# Patient Record
Sex: Male | Born: 1985
Health system: Southern US, Community
[De-identification: ages and names within clinical notes are randomized; demographics above are authoritative.]

## PROBLEM LIST (undated history)

## (undated) DIAGNOSIS — K219 Gastro-esophageal reflux disease without esophagitis: Secondary | ICD-10-CM

## (undated) DIAGNOSIS — L600 Ingrowing nail: Secondary | ICD-10-CM

## (undated) DIAGNOSIS — Z8619 Personal history of other infectious and parasitic diseases: Secondary | ICD-10-CM

## (undated) HISTORY — DX: Gastro-esophageal reflux disease without esophagitis: K21.9

## (undated) HISTORY — DX: Ingrowing nail: L60.0

## (undated) HISTORY — DX: Personal history of other infectious and parasitic diseases: Z86.19

## (undated) HISTORY — PX: NO PAST SURGERIES: SHX2092

## (undated) HISTORY — PX: IRRIGATION AND DEBRIDEMENT SEBACEOUS CYST: SHX5255

---

## 2014-10-30 ENCOUNTER — Emergency Department (HOSPITAL_COMMUNITY)
Admission: EM | Admit: 2014-10-30 | Discharge: 2014-10-31 | Disposition: A | Discharge status: Home / Self Care | Payer: Self-pay | Attending: Emergency Medicine | Admitting: Emergency Medicine

## 2014-10-30 ENCOUNTER — Encounter (HOSPITAL_COMMUNITY): Payer: Self-pay | Admitting: Family Medicine

## 2014-10-30 ENCOUNTER — Emergency Department (HOSPITAL_COMMUNITY): Payer: Self-pay

## 2014-10-30 DIAGNOSIS — R0602 Shortness of breath: Secondary | ICD-10-CM | POA: Insufficient documentation

## 2014-10-30 DIAGNOSIS — R002 Palpitations: Secondary | ICD-10-CM | POA: Insufficient documentation

## 2014-10-30 DIAGNOSIS — R42 Dizziness and giddiness: Secondary | ICD-10-CM | POA: Insufficient documentation

## 2014-10-30 DIAGNOSIS — R079 Chest pain, unspecified: Secondary | ICD-10-CM

## 2014-10-30 LAB — BASIC METABOLIC PANEL
Anion gap: 12 (ref 5–15)
BUN: 10 mg/dL (ref 6–23)
CALCIUM: 9.3 mg/dL (ref 8.4–10.5)
CO2: 25 mmol/L (ref 19–32)
Chloride: 99 mEq/L (ref 96–112)
Creatinine, Ser: 1.1 mg/dL (ref 0.50–1.35)
GFR calc Af Amer: >90 mL/min (ref >90)
GFR calc non Af Amer: 89 mL/min — ABNORMAL LOW (ref >90)
Glucose, Bld: 119 mg/dL — ABNORMAL HIGH (ref 70–99)
POTASSIUM: 3.7 mmol/L (ref 3.5–5.1)
SODIUM: 136 mmol/L (ref 135–145)

## 2014-10-30 LAB — CBC
HCT: 45.8 % (ref 39.0–52.0)
Hemoglobin: 16 g/dL (ref 13.0–17.0)
MCH: 30.1 pg (ref 26.0–34.0)
MCHC: 34.9 g/dL (ref 30.0–36.0)
MCV: 86.3 fL (ref 78.0–100.0)
Platelets: 250 10*3/uL (ref 150–400)
RBC: 5.31 MIL/uL (ref 4.22–5.81)
RDW: 12.6 % (ref 11.5–15.5)
WBC: 16.9 10*3/uL — ABNORMAL HIGH (ref 4.0–10.5)

## 2014-10-30 LAB — TSH: TSH: 0.961 u[IU]/mL (ref 0.350–4.500)

## 2014-10-30 LAB — I-STAT TROPONIN, ED
TROPONIN I, POC: 0 ng/mL (ref 0.00–0.08)
Troponin i, poc: 0 ng/mL (ref 0.00–0.08)

## 2014-10-30 LAB — D-DIMER, QUANTITATIVE: D-Dimer, Quant: <0.27 ug/mL-FEU (ref 0.00–0.48)

## 2014-10-30 MED ORDER — SODIUM CHLORIDE 0.9 % IV BOLUS (SEPSIS)
1000.0000 mL | Freq: Once | INTRAVENOUS | Status: AC
Start: 2014-10-30 — End: 2014-10-30
  Administered 2014-10-30: 1000 mL via INTRAVENOUS

## 2014-10-30 NOTE — ED Notes (Signed)
Report to melanie, rn.  Pt care transferred.

## 2014-10-30 NOTE — ED Notes (Signed)
Pt had 1 min episode today at work of where his heart felt like it would slow down and then it sped up. sts he was SOB. sts he has had a few of these episodes. sts he was sitting at his desk at work.denies chest pain and SOB currently.

## 2014-10-30 NOTE — ED Provider Notes (Signed)
CSN: [id number]     Arrival date & time 10/30/14  1833 History   First MD Initiated Contact with Patient 10/30/14 2000     Chief Complaint  Patient presents with  . Palpitations  . Shortness of Breath     (Consider location/radiation/quality/duration/timing/severity/associated sxs/prior Treatment) The history is provided by the patient. No language interpreter was used.  Maurice Jimenez is a 29 y/o M with no known significant PMHx presenting to the ED with chest palpitations that occurred today while the patient was at work. Patient reported that sits at a desk for most of the day - stated that while at work he had chest palpitations. Reported that he felt his heart slow down and then speed up - stated that it lasted for approximately 30-40 seconds. Reported that during this episode he felt hot and mildly light-headed with a little shortness of breath. Reported that he had another episode when driving home - stated that he stopped at a gas station and took ASA 325 mg. Reported that his last episode was at 5:45 PM this evening. Denied diaphoresis, chest pain, difficulty breathing, loss of consciousness, dizziness, tunnel vision, blurred vision, sudden loss of vision, nausea, vomiting, diarrhea, abdominal pain, neck pain, neck stiffness, headache, cough, nasal congestion, numbness, tingling, weakness, fever, chills. Denied history of hypertension and diabetes. Denied history of cardiac disease in the family. PCP none  History reviewed. No pertinent past medical history. History reviewed. No pertinent past surgical history. History reviewed. No pertinent family history. History  Substance Use Topics  . Smoking status: Never Smoker   . Smokeless tobacco: Not on file  . Alcohol Use: Not on file    Review of Systems  Constitutional: Negative for fever and chills.  Eyes: Negative for visual disturbance.  Respiratory: Positive for shortness of breath. Negative for cough and chest tightness.    Cardiovascular: Positive for palpitations. Negative for chest pain and leg swelling.  Gastrointestinal: Negative for nausea and vomiting.  Musculoskeletal: Negative for back pain, neck pain and neck stiffness.  Neurological: Positive for light-headedness. Negative for dizziness, weakness, numbness and headaches.      Allergies  Review of patient's allergies indicates no known allergies.  Home Medications   Prior to Admission medications   Not on File   BP 116/62 mmHg  Pulse 89  Temp(Src) 98.4 F (36.9 C) (Oral)  Resp 22  Ht 6' (1.829 m)  Wt 245 lb (111.131 kg)  BMI 33.22 kg/m2  SpO2 98% Physical Exam  Constitutional: He is oriented to person, place, and time. He appears well-developed and well-nourished. No distress.  HENT:  Head: Normocephalic and atraumatic.  Mouth/Throat: Oropharynx is clear and moist. No oropharyngeal exudate.  Eyes: Conjunctivae and EOM are normal. Pupils are equal, round, and reactive to light. Right eye exhibits no discharge. Left eye exhibits no discharge.  Neck: Normal range of motion. Neck supple. No tracheal deviation present.  Negative neck stiffness Negative nuchal rigidity Negative cervical lymphadenopathy Negative meningeal signs  Cardiovascular: Normal rate, regular rhythm and normal heart sounds.  Exam reveals no friction rub.   No murmur heard. Pulses:      Radial pulses are 2+ on the right side, and 2+ on the left side.       Dorsalis pedis pulses are 2+ on the right side, and 2+ on the left side.  Cap refill less than 3 seconds Negative swelling or pitting edema identified to lower extremities bilaterally  Pulmonary/Chest: Effort normal and breath sounds normal. No respiratory  distress. He has no wheezes. He has no rales. He exhibits no tenderness.  Patient is able to speak in full sentences without difficulty Negative use of accessory muscles Negative stridor  Musculoskeletal: Normal range of motion.  Full ROM to upper and lower  extremities without difficulty noted, negative ataxia noted.  Lymphadenopathy:    He has no cervical adenopathy.  Neurological: He is alert and oriented to person, place, and time. No cranial nerve deficit. He exhibits normal muscle tone. Coordination normal.  Cranial nerves III-XII grossly intact Strength 5+/5+ to upper and lower extremities bilaterally with resistance applied, equal distribution noted Equal grip strength bilaterally Negative facial droop Negative slurred speech Negative aphasia Negative arm drift Fine motor skills intact Patient follows commands well Patient responds to questions appropriately  Skin: Skin is warm and dry. No rash noted. He is not diaphoretic. No erythema.  Psychiatric: He has a normal mood and affect. His behavior is normal. Thought content normal.  Nursing note and vitals reviewed.   ED Course  Procedures (including critical care time) Labs Review Labs Reviewed  CBC - Abnormal; Notable for the following:    WBC 16.9 (*)    All other components within normal limits  BASIC METABOLIC PANEL - Abnormal; Notable for the following:    Glucose, Bld 119 (*)    GFR calc non Af Amer 89 (*)    All other components within normal limits  D-DIMER, QUANTITATIVE  TSH  T4, FREE  I-STAT TROPOININ, ED  I-STAT TROPOININ, ED    Imaging Review Dg Chest 2 View  10/30/2014   CLINICAL DATA:  Pt states he experienced a funny feeling in his chest today, described as his "heart slowing down." Pt states he had trouble feeling his pulse during this time and shortly after he experienced his heart racing.  Pt has no hx of any similar issues.  Nonsmoker.  EXAM: CHEST  2 VIEW  COMPARISON:  10/21/2007  FINDINGS: The heart size and mediastinal contours are within normal limits. Both lungs are clear. The visualized skeletal structures are unremarkable.  IMPRESSION: No active cardiopulmonary disease.   Electronically Signed   By: Lajean Manes M.D.   On: 10/30/2014 19:11      EKG Interpretation   Date/Time:  Monday October 30 2014 18:40:10 EST Ventricular Rate:  110 PR Interval:  164 QRS Duration: 92 QT Interval:  312 QTC Calculation: 422 R Axis:   -14 Text Interpretation:  Sinus tachycardia Left ventricular hypertrophy  Abnormal ECG Confirmed by Jeneen Rinks  MD, [place] ([id number]) on 10/30/2014 6:44:13 PM      MDM   Final diagnoses:  Palpitations    Medications  sodium chloride 0.9 % bolus 1,000 mL (0 mLs Intravenous Stopped 10/30/14 2220)    Filed Vitals:   10/30/14 2216 10/30/14 2230 10/30/14 2330 10/31/14 0006  BP: 121/76 122/65 124/77 116/62  Pulse: 94 82 80 89  Temp:    98.4 F (36.9 C)  TempSrc:    Oral  Resp: 18 17 11 22   Height:      Weight:      SpO2: 97% 98% 96% 98%   EKG noted sinus tachycardia with a heart rate 110 bpm with left ventricular hypertrophy-no previous EKG to compare. I-STAT troponin negative elevation. Second i-STAT troponin negative elevation. D-dimer negative elevation. CBC noted elevated white blood cell count-16.9. Hemoglobin 16.0, hematocrit 45.8. BNP unremarkable. Chest x-ray negative for acute cardiopulmonary disease. TSH negative elevation. T4 free pending.  Doubt ACS. Doubt PE. Patient presenting  to the ED with chest palpitations that occurred today, 2 episodes. Patient reported that he's been stressed out and appears to be mildly anxious. Patient given IV fluids in ED setting with relief of tachycardia - heart rate decreased from 119 bpm to 80 bpm. Patient seen and assessed by attending physician, Dr. Vallery Ridge, who cleared patient for discharge. Patient stable, afebrile. Patient not septic appearing. Definitive etiology of palpitations unknown-negative auscultation of murmurs. Negative signs of respiratory distress. Discharged patient. Referred patient to PCP and cardiology. Discussed with patient to rest and stay hydrated. Discussed with patient to have white blood cell count rechecked secondary to it being elevated  today-patient not septic appearing, no signs of infectious processes, patient denies remaining complaints. Discussed with patient to rest and stay hydrated. Discussed with patient to closely monitor symptoms and if symptoms are to worsen or change to report back to the ED - strict return instructions given.  Patient agreed to plan of care, understood, all questions answered.  Jamse Mead, PA-C 10/31/14 0016  Charlesetta Shanks, MD 10/31/14 2226

## 2014-10-31 LAB — T4, FREE: FREE T4: 1.17 ng/dL (ref 0.80–1.80)

## 2014-10-31 NOTE — Discharge Instructions (Signed)
Please call your doctor for a followup appointment within 24-48 hours. When you talk to your doctor please let them know that you were seen in the emergency department and have them acquire all of your records so that they can discuss the findings with you and formulate a treatment plan to fully care for your new and ongoing problems. Please call and follow-up with her primary care provider Please have white blood cell count re-checked for today it was mildly elevated Please rest and stay hydrated Please follow up with cardiology Please drink plenty of fluids Please continue to monitor symptoms closely and if symptoms are to worsen or change (fever greater than 101, chills, sweating, nausea, vomiting, chest pain, shortness of breathe, difficulty breathing, weakness, numbness, tingling, worsening or changes to pain pattern, headache, dizziness, visual changes, fainting) please report back to the Emergency Department immediately.   Palpitations A palpitation is the feeling that your heartbeat is irregular or is faster than normal. It may feel like your heart is fluttering or skipping a beat. Palpitations are usually not a serious problem. However, in some cases, you may need further medical evaluation. CAUSES  Palpitations can be caused by:  Smoking.  Caffeine or other stimulants, such as diet pills or energy drinks.  Alcohol.  Stress and anxiety.  Strenuous physical activity.  Fatigue.  Certain medicines.  Heart disease, especially if you have a history of irregular heart rhythms (arrhythmias), such as atrial fibrillation, atrial flutter, or supraventricular tachycardia.  An improperly working pacemaker or defibrillator. DIAGNOSIS  To find the cause of your palpitations, your health care provider will take your medical history and perform a physical exam. Your health care provider may also have you take a test called an ambulatory electrocardiogram (ECG). An ECG records your heartbeat  patterns over a 24-hour period. You may also have other tests, such as:  Transthoracic echocardiogram (TTE). During echocardiography, sound waves are used to evaluate how blood flows through your heart.  Transesophageal echocardiogram (TEE).  Cardiac monitoring. This allows your health care provider to monitor your heart rate and rhythm in real time.  Holter monitor. This is a portable device that records your heartbeat and can help diagnose heart arrhythmias. It allows your health care provider to track your heart activity for several days, if needed.  Stress tests by exercise or by giving medicine that makes the heart beat faster. TREATMENT  Treatment of palpitations depends on the cause of your symptoms and can vary greatly. Most cases of palpitations do not require any treatment other than time, relaxation, and monitoring your symptoms. Other causes, such as atrial fibrillation, atrial flutter, or supraventricular tachycardia, usually require further treatment. HOME CARE INSTRUCTIONS   Avoid:  Caffeinated coffee, tea, soft drinks, diet pills, and energy drinks.  Chocolate.  Alcohol.  Stop smoking if you smoke.  Reduce your stress and anxiety. Things that can help you relax include:  A method of controlling things in your body, such as your heartbeats, with your mind (biofeedback).  Yoga.  Meditation.  Physical activity such as swimming, jogging, or walking.  Get plenty of rest and sleep. SEEK MEDICAL CARE IF:   You continue to have a fast or irregular heartbeat beyond 24 hours.  Your palpitations occur more often. SEEK IMMEDIATE MEDICAL CARE IF:  You have chest pain or shortness of breath.  You have a severe headache.  You feel dizzy or you faint. MAKE SURE YOU:  Understand these instructions.  Will watch your condition.  Will get  help right away if you are not doing well or get worse. Document Released: 09/26/2000 Document Revised: 10/04/2013 Document  Reviewed: 11/28/2011 [facility] Patient Information 2015 [place], [place]. This information is not intended to replace advice given to you by your health care provider. Make sure you discuss any questions you have with your health care provider.   Emergency Department Resource Guide 1) Find a Doctor and Pay Out of Pocket Although you won't have to find out who is covered by your insurance plan, it is a good idea to ask around and get recommendations. You will then need to call the office and see if the doctor you have chosen will accept you as a new patient and what types of options they offer for patients who are self-pay. Some doctors offer discounts or will set up payment plans for their patients who do not have insurance, but you will need to ask so you aren't surprised when you get to your appointment.  2) Contact Your Local Health Department Not all health departments have doctors that can see patients for sick visits, but many do, so it is worth a call to see if yours does. If you don't know where your local health department is, you can check in your phone book. The CDC also has a tool to help you locate your state's health department, and many state websites also have listings of all of their local health departments.  3) Find a [facility] If your illness is not likely to be very severe or complicated, you may want to try a walk in clinic. These are popping up all over the country in pharmacies, drugstores, and shopping centers. They're usually staffed by nurse practitioners or physician assistants that have been trained to treat common illnesses and complaints. They're usually fairly quick and inexpensive. However, if you have serious medical issues or chronic medical problems, these are probably not your best option.  No Primary Care Doctor: - Call Health Connect at  [phone] - they can help you locate a primary care doctor that  accepts your insurance, provides certain services,  etc. - Physician Referral Service- [phone]  Chronic Pain Problems: Organization         Address  Phone   Notes  [facility]  [phone] Patients need to be referred by their primary care doctor.   Medication Assistance: Organization         Address  Phone   Notes  [facility] Medication [facility] [address] [place], [employer] [address] [phone] --Must be a resident of [facility] -- Must have NO insurance coverage whatsoever (no Medicaid/ Medicare, etc.) -- The pt. MUST have a primary care doctor that directs their care regularly and follows them in the community   Med[facility]ist  [phone]   [employer]  [phone]    Agencies that provide inexpensive medical care: Organization         Address  Phone   Notes  [place]  [phone]   [facility]    [phone]   [facility] [place], [place] [address] [phone]   [place] [address], [place] [phone]   Planned Parenthood    [phone]   [facility]    [phone]   [place] and [place] [address], [place] Phone:  [phone], Fax:  [phone] Hours of Operation:  9 am - 6 pm, M-F.  Also accepts Medicaid/Medicare and self-pay.  [facility] for [place] [address], [address], [place] Phone: [phone], Fax: [phone]. Hours of Operation:  8:30 am - 5:30 pm, M-F.  Also accepts Medicaid and self-pay.  [facility] [place] [address] [place] Phone: [phone]   [place], [place], [place] [phone], Ext. [phone] Mondays & Thursdays: 7-9 AM.  First 15 patients are seen on a first come, first serve basis.    [place] Providers:  Organization         Address  Phone   Notes  [facility] [address], [place] [phone] Also accepts self-pay patients.  [facility] [address], [place]  [phone]   [place], [address], [place] [phone]   [facility] [address], [place] [phone]   Lucianne Lei [address], [place]   [phone] Only accepts [place] Access [place] patients after they have their name applied to their card.   Self-Pay (no insurance) in [facility]:  Organization         Address  Phone   Notes  Sickle Cell Patients, [facility] [place] [phone]   [facility] [place] [phone]   [facility] [place]  [place], [place], [place] [phone]   [facility]/Dr. Osei-Bonsu  [address] [place] or [address], [place] [phone] Phone number for both [place] and [place] locations is the same.  [facility] [place] [place] [address] [place] [phone]   [facility] [address], [place] or [address] [phone]   [facility] [address], [place] [phone], phone; [phone], fax Sees patients 1st and 3rd Saturday of every month.  Must not qualify for public or private insurance (i.e. Medicaid, Medicare, [place] Health Choice, Veterans' Benefits)  Household income should be no more than 200% of the poverty level The clinic cannot treat you if you are pregnant or think you are pregnant  Sexually transmitted diseases are not treated at the clinic.    Dental Care: Organization         Address  Phone  Notes  [facility] Department of [facility] [place] [phone] Accepts children up to  age 26 who are enrolled in [place] or [place]; pregnant women with a Medicaid card; and children who have applied for Medicaid or [place] Health Choice, but were declined, whose parents can pay a reduced fee at time of service.  [facility] Department of [facility]  [address], [place] [phone] Accepts children up to age 72 who are enrolled in [place] or [place]; pregnant women with a Medicaid card; and children who have applied for Medicaid or  Health Choice, but were declined, whose parents can pay a reduced fee at time of service.  [place] Adult Dental Access PROGRAM  [place] [phone] Patients are seen by appointment only. Walk-ins are not accepted. [place]  Dental will see patients 93 years of age and older. Monday - Tuesday (8am-5pm) Most Wednesdays (8:30-5pm) $30 per visit, cash only  [facility] Adult Dental Access PROGRAM  [address], [facility] [phone] Patients are seen by appointment only. Walk-ins are not accepted. [place] will see patients 52 years of age and older. One Wednesday Evening (Monthly: Volunteer Based).  $30 per visit, cash only  [place]  [phone] for adults; Children under age 40, call Graduate Pediatric Dentistry at [phone]. Children aged 68-14, please call [phone] to request a pediatric application.  Dental services are provided in all areas of dental care including fillings, crowns and bridges, complete and partial dentures, implants, gum treatment, root canals, and extractions. Preventive care is also provided. Treatment is provided to both adults and children. Patients are selected via a lottery and there is often a waiting list.   [facility] [address], [place]  [phone] [email or web address]   [facility] [address] [place], [place] [phone], Ext. [phone] Second and Fourth Thursday of  each month, opens at 6:30 AM; Clinic ends at 9 AM.  Patients are seen on a first-come first-served basis, and a limited number are seen during each clinic.   [facility]  [address] Hillard Danker [place], [place] [phone]   Eligibility Requirements You must have lived in [place], [place], or [place] counties for at least the last three months.   You cannot be eligible for state or federal sponsored [employer], including [employer], [place], or [employer].   You generally cannot be eligible for healthcare insurance through your employer.    How to apply: Eligibility screenings are held every Tuesday and Wednesday afternoon from 1:00 pm until 4:00 pm. You do not need an appointment for the interview!  [facility] [address] [place], [place]   [place]  [place] Department  [place]  [phone]    Behavioral Health Resources in the Community: Intensive Outpatient Programs Organization         Address  Phone  Notes  [place]. [address] [place], [place] [phone]   [facility]patient [address], [place], [place]   ADS: [facility] Drug [facility] [address] [place], [place]   [place] [address]  [place], [place] or [phone]   Substance Abuse Resources Organization         Address  Phone  Notes  Alcohol and Drug Services  [phone]   [place]  [phone]   [facility] [place]   Chinita Pester  [phone]   Residential & Outpatient Substance Abuse Program  [phone]   Psychological Services Organization         Address  Phone  Notes  [facility] [place]  [place]  [phone]   [place] [address],  [place] or [phone]    Mobile Crisis Teams Organization         Address  Phone  Notes  [facility], Mobile Crisis Care Unit  [phone]   [facility]otherapeutic [facility]  [address] [place], [place]   [facility] [address] [place] [phone]    Self-Help/Support Groups Organization  Address  Phone             Notes  [place]. of [place] - variety of support groups  [place] Call for more information  Narcotics Anonymous (NA), [facility] [address], [employer] [place]  2 meetings at this location   Special educational needs teacher         Address  Phone  Notes  [facility] Residential Treatment [place],    [place]  [phone]   [facility]  [address], [place] [phone], [place], [place]   [place], [place] [phone] Admissions: 8am-3pm M-F  Incentives Substance [place] [address]    [place], [place] [phone]   [facility] [address] [place], [place], [place]   [facility] [address],  [place], [place]   [facility] - Intensive Outpatient [address] Kristeen [address], [place], [place]   [facility] ([place].) [place].,  [place], [place] [phone] or [phone]   [facility] Services (RTS) [address], [place], [place] Accepts Medicaid  [facility] [place] [address]  [place] [phone] Substance Abuse/Addiction Treatment   [facility] Organization         Address  Phone  Notes  [facility]  [phone]   Domenic Schwab, PhD [address] Arlis [place], [place]   [phone] or [phone]   [place] [place], [place] [phone]     [facility] [address] [place], [place] [phone] Insurance/Medicaid/sponsorship through [facility] and Families [address], [address] [place]                                    [place], [place] [phone] [place] [address], [place] [phone]    Dr. Adele Schilder  [phone]   [facility] [place] Dept. 1) [address] [place] [address], [place] 2) [place] 3)  [place] [address], [place] [phone]  [phone]   [place] [phone] or [phone] (After Hours)

## 2014-10-31 NOTE — ED Notes (Signed)
Pt A&OX4, ambulatory at d/c with steady gait, NAD 

## 2014-11-22 ENCOUNTER — Encounter (HOSPITAL_COMMUNITY): Payer: Self-pay | Admitting: Neurology

## 2014-11-22 ENCOUNTER — Emergency Department (HOSPITAL_COMMUNITY)
Admission: EM | Admit: 2014-11-22 | Discharge: 2014-11-22 | Disposition: A | Discharge status: Home / Self Care | Payer: Self-pay | Attending: Emergency Medicine | Admitting: Emergency Medicine

## 2014-11-22 ENCOUNTER — Emergency Department (HOSPITAL_COMMUNITY): Payer: Self-pay

## 2014-11-22 DIAGNOSIS — R0789 Other chest pain: Secondary | ICD-10-CM | POA: Insufficient documentation

## 2014-11-22 DIAGNOSIS — F419 Anxiety disorder, unspecified: Secondary | ICD-10-CM | POA: Insufficient documentation

## 2014-11-22 DIAGNOSIS — Z7982 Long term (current) use of aspirin: Secondary | ICD-10-CM | POA: Insufficient documentation

## 2014-11-22 DIAGNOSIS — R079 Chest pain, unspecified: Secondary | ICD-10-CM

## 2014-11-22 LAB — I-STAT TROPONIN, ED
Troponin i, poc: 0 ng/mL (ref 0.00–0.08)
Troponin i, poc: 0 ng/mL (ref 0.00–0.08)

## 2014-11-22 LAB — BASIC METABOLIC PANEL
Anion gap: 12 (ref 5–15)
BUN: 11 mg/dL (ref 6–23)
CO2: 25 mmol/L (ref 19–32)
Calcium: 9.7 mg/dL (ref 8.4–10.5)
Chloride: 104 mmol/L (ref 96–112)
Creatinine, Ser: 1.08 mg/dL (ref 0.50–1.35)
GFR calc Af Amer: >90 mL/min (ref >90)
GFR calc non Af Amer: >90 mL/min (ref >90)
GLUCOSE: 103 mg/dL — AB (ref 70–99)
POTASSIUM: 3.8 mmol/L (ref 3.5–5.1)
Sodium: 141 mmol/L (ref 135–145)

## 2014-11-22 LAB — CBC
HCT: 44.1 % (ref 39.0–52.0)
HEMOGLOBIN: 15.2 g/dL (ref 13.0–17.0)
MCH: 30.1 pg (ref 26.0–34.0)
MCHC: 34.5 g/dL (ref 30.0–36.0)
MCV: 87.3 fL (ref 78.0–100.0)
Platelets: 246 10*3/uL (ref 150–400)
RBC: 5.05 MIL/uL (ref 4.22–5.81)
RDW: 12.7 % (ref 11.5–15.5)
WBC: 6.9 10*3/uL (ref 4.0–10.5)

## 2014-11-22 MED ORDER — GI COCKTAIL ~~LOC~~
30.0000 mL | Freq: Once | ORAL | Status: AC
  Administered 2014-11-22: 30 mL via ORAL
  Filled 2014-11-22: qty 30

## 2014-11-22 MED ORDER — OMEPRAZOLE 20 MG PO CPDR: DELAYED_RELEASE_CAPSULE | ORAL | Status: DC

## 2014-11-22 NOTE — Discharge Instructions (Signed)
Please read and follow all provided instructions.  Your diagnoses today include:  1. Chest pain, unspecified chest pain type     Tests performed today include:  An EKG of your heart  A chest x-ray  Cardiac enzymes - a blood test for heart muscle damage  Blood counts and electrolytes  Vital signs. See below for your results today.   Medications prescribed:   Omeprazole (Prilosec) - stomach acid reducer  This medication can be found over-the-counter  Take any prescribed medications only as directed.  Follow-up instructions: Please follow-up with your primary care provider as soon as you can for further evaluation of your symptoms.   Return instructions:  SEEK IMMEDIATE MEDICAL ATTENTION IF:  You have severe chest pain, especially if the pain is crushing or pressure-like and spreads to the arms, back, neck, or jaw, or if you have sweating, nausea (feeling sick to your stomach), or shortness of breath. THIS IS AN EMERGENCY. Don't wait to see if the pain will go away. Get medical help at once. Call 911 or 0 (operator). DO NOT drive yourself to the hospital.   Your chest pain gets worse and does not go away with rest.   You have an attack of chest pain lasting longer than usual, despite rest and treatment with the medications your caregiver has prescribed.   You wake from sleep with chest pain or shortness of breath.  You feel dizzy or faint.  You have chest pain not typical of your usual pain for which you originally saw your caregiver.   You have any other emergent concerns regarding your health.  Additional Information: Chest pain comes from many different causes. Your caregiver has diagnosed you as having chest pain that is not specific for one problem, but does not require admission.  You are at low risk for an acute heart condition or other serious illness.   Your vital signs today were: BP 131/76 mmHg   Pulse 79   Temp(Src) 98.3 F (36.8 C) (Oral)   Resp 17   Ht 6'  (1.829 m)   Wt 250 lb (113.399 kg)   BMI 33.90 kg/m2   SpO2 97% If your blood pressure (BP) was elevated above 135/85 this visit, please have this repeated by your doctor within one month. --------------

## 2014-11-22 NOTE — ED Notes (Signed)
Pt made aware to return if symptoms worsen or if any life threatening symptoms occur.   

## 2014-11-22 NOTE — ED Notes (Signed)
Pt reports central cp, burning in chest since 0730 this morning. Reports pain same last night. Denies cardiac hx.

## 2014-11-22 NOTE — ED Provider Notes (Signed)
CSN: [id number]     Arrival date & time 11/22/14  7106 History   First MD Initiated Contact with Patient 11/22/14 [phone]     Chief Complaint  Patient presents with  . Chest Pain     (Consider location/radiation/quality/duration/timing/severity/associated sxs/prior Treatment) HPI Comments: Patient with no significant past medical history presents with complaint of a burning chest pain in the left chest starting at 7:30 AM today. Patient was seen in emergency department 2 weeks ago for palpitations. He did not have chest pain or tightness at that time. Patient had an EKG which showed possible left ventricular hypertrophy. He had a negative d-dimer. Patient had troponin negative 2. Patient states that over the past 2 weeks he has had multiple episodes of tightness in left chest that occur at rest, are not associated with activity. Symptoms are not associated with shortness of breath, diaphoresis, nausea or vomiting. At his last visit, patient was given cardiology and PCP referrals. Patient states that he has not followed up due to insurance issues. He states that his insurance will start tomorrow and he plans on following up at that time. Patient does not have any other medical complaints. Patient took aspirin for symptoms without relief. Patient states that he had an episode last evening which was the worst that he has had. This also occurred at rest. He called the paramedics and was checked but his pain resolved and she did not come to the hospital for evaluation. Patient denies a history of hypertension, high cholesterol, hyperlipidemia, smoking, family history of coronary artery disease. No relatives with sudden cardiac death at a young age.  Patient is a 29 y.o. male presenting with chest pain. The history is provided by the patient and medical records.  Chest Pain Associated symptoms: no abdominal pain, no back pain, no cough, no diaphoresis, no fever, no nausea, no palpitations, no shortness of  breath and not vomiting     History reviewed. No pertinent past medical history. History reviewed. No pertinent past surgical history. No family history on file. History  Substance Use Topics  . Smoking status: Never Smoker   . Smokeless tobacco: Not on file  . Alcohol Use: Yes    Review of Systems  Constitutional: Negative for fever and diaphoresis.  Eyes: Negative for redness.  Respiratory: Negative for cough and shortness of breath.   Cardiovascular: Positive for chest pain. Negative for palpitations and leg swelling.  Gastrointestinal: Negative for nausea, vomiting and abdominal pain.  Genitourinary: Negative for dysuria.  Musculoskeletal: Negative for back pain and neck pain.  Skin: Negative for rash.  Neurological: Negative for syncope and light-headedness.      Allergies  Review of patient's allergies indicates no known allergies.  Home Medications   Prior to Admission medications   Medication Sig Start Date End Date Taking? Authorizing Provider  aspirin 325 MG tablet Take 325 mg by mouth daily as needed for mild pain or moderate pain.   Yes Historical Provider, MD  ibuprofen (ADVIL,MOTRIN) 200 MG tablet Take 200 mg by mouth every 6 (six) hours as needed for mild pain or moderate pain.   Yes Historical Provider, MD   BP 134/88 mmHg  Pulse 97  Temp(Src) 98.3 F (36.8 C) (Oral)  Resp 19  Ht 6' (1.829 m)  Wt 250 lb (113.399 kg)  BMI 33.90 kg/m2  SpO2 98%   Physical Exam  Constitutional: He appears well-developed and well-nourished.  HENT:  Head: Normocephalic and atraumatic.  Mouth/Throat: Mucous membranes are normal. Mucous membranes  are not dry.  Eyes: Conjunctivae are normal.  Neck: Trachea normal and normal range of motion. Neck supple. Normal carotid pulses and no JVD present. No muscular tenderness present. Carotid bruit is not present. No tracheal deviation present.  Cardiovascular: Normal rate, regular rhythm, S1 normal, S2 normal, normal heart sounds  and intact distal pulses.  Exam reveals no distant heart sounds and no decreased pulses.   No murmur heard. Pulmonary/Chest: Effort normal and breath sounds normal. No respiratory distress. He has no wheezes. He exhibits no tenderness.  Abdominal: Soft. Normal aorta and bowel sounds are normal. There is no tenderness. There is no rebound and no guarding.  Musculoskeletal: He exhibits no edema.  Neurological: He is alert.  Skin: Skin is warm and dry. He is not diaphoretic. No cyanosis. No pallor.  Psychiatric: His mood appears anxious.  Nursing note and vitals reviewed.   ED Course  Procedures (including critical care time) Labs Review Labs Reviewed  BASIC METABOLIC PANEL - Abnormal; Notable for the following:    Glucose, Bld 103 (*)    All other components within normal limits  CBC  I-STAT TROPOININ, ED  Randolm Idol, ED    Imaging Review Dg Chest 2 View  11/22/2014   CLINICAL DATA:  Chest pain and tightness for 2 days  EXAM: CHEST  2 VIEW  COMPARISON:  10/30/2014  FINDINGS: Cardiac shadow is within normal limits. The lungs are well aerated bilaterally. No focal infiltrate or sizable effusion is seen. No acute bony abnormality is noted.  IMPRESSION: No active cardiopulmonary disease.   Electronically Signed   By: Inez Catalina M.D.   On: 11/22/2014 08:17     EKG Interpretation   Date/Time:  Wednesday November 22 2014 07:50:10 EST Ventricular Rate:  110 PR Interval:  176 QRS Duration: 88 QT Interval:  332 QTC Calculation: 449 R Axis:   -3 Text Interpretation:  Sinus tachycardia Moderate voltage criteria for LVH,  may be normal variant Left axis deviation When compared with ECG of  10/30/2014 No significant change was found Confirmed by [facility]  MD,  Nunzio Cory ([id number]) on 11/22/2014 8:37:55 AM       9:14 AM Patient seen and examined. Work-up initiated. Medications ordered. EKG reviewed.   Vital signs reviewed and are as follows: BP 134/88 mmHg  Pulse 97  Temp(Src) 98.3  F (36.8 C) (Oral)  Resp 19  Ht 6' (1.829 m)  Wt 250 lb (113.399 kg)  BMI 33.90 kg/m2  SpO2 98%  12:24 PM second marker is negative. Patient informed of results. He is feeling better.  Patient was counseled to return with severe chest pain, especially if the pain is crushing or pressure-like and spreads to the arms, back, neck, or jaw, or if they have sweating, nausea, or shortness of breath with the pain. They were encouraged to call 911 with these symptoms.   They were also told to return if their chest pain gets worse and does not go away with rest, they have an attack of chest pain lasting longer than usual despite rest and treatment with the medications their caregiver has prescribed, if they wake from sleep with chest pain or shortness of breath, if they feel dizzy or faint, if they have chest pain not typical of their usual pain, or if they have any other emergent concerns regarding their health.  Patient encouraged strongly to follow-up with cardiologist and PCP referrals as given.  We discussed medication for anxiety, will defer decision to PCP follow-up.  The  patient verbalized understanding and agreed.    MDM   Final diagnoses:  Chest pain, unspecified chest pain type   Patient with chest tightness. Feel patient is low risk for ACS given history (poor story for ACS/MI), negative troponin(s), normal/unchanged EKG. Do not suspect PE given lack of risk factors and normal vital signs. No real signs of peri/myocarditis. Chest x-ray is clear.     Carlisle Cater, PA-C 11/22/14 [place], DO 11/22/14 1949

## 2014-11-27 ENCOUNTER — Encounter: Payer: Self-pay | Admitting: Physician Assistant

## 2014-11-27 ENCOUNTER — Ambulatory Visit (INDEPENDENT_AMBULATORY_CARE_PROVIDER_SITE_OTHER): Payer: Self-pay | Admitting: Physician Assistant

## 2014-11-27 VITALS — BP 139/84 | HR 93 | Temp 99.0°F | Resp 16 | Ht 72.0 in | Wt 251.0 lb

## 2014-11-27 DIAGNOSIS — R079 Chest pain, unspecified: Secondary | ICD-10-CM

## 2014-11-27 DIAGNOSIS — F411 Generalized anxiety disorder: Secondary | ICD-10-CM

## 2014-11-27 MED ORDER — ESCITALOPRAM OXALATE 10 MG PO TABS: ORAL_TABLET | ORAL | Status: DC

## 2014-11-27 MED ORDER — RANITIDINE HCL 150 MG PO TABS: 150.0000 mg | ORAL_TABLET | Freq: Every day | ORAL | Status: DC

## 2014-11-27 MED ORDER — DIAZEPAM 2 MG PO TABS: 2.0000 mg | ORAL_TABLET | Freq: Four times a day (QID) | ORAL | Status: DC | PRN

## 2014-11-27 NOTE — Patient Instructions (Signed)
Please continue the Prilosec daily.  Add on Zantac before dinner.  Stay well-hydrated.  Avoid late-night eating.  Please start the Lexapro as directed at bedtime.  Use Valium for acute anxiety.    You will be contacted by a Cardioloigist for further assessment.  If symptoms recur or are associated with shortness of breath or dizziness, please call 911 or go to the nearest ER.

## 2014-11-27 NOTE — Assessment & Plan Note (Signed)
Asymptomatic at present.  Suspect likely GERD in nature giving improvement with PPI therapy.  Will add-on Zantac nightly. GERD diet discussed. Giving LVH noted on EKG, will refer to cardiology for further assessment and echocardiogram. Alarm signs/symptoms discussed with patient.  Follow-up in 3-4 weeks.

## 2014-11-27 NOTE — Assessment & Plan Note (Signed)
Will begin Lexapro 5 mg nightly x 1 week, then increase to 10 mg nightly.  Valium 2 mg as needed for acute anxiety.

## 2014-11-27 NOTE — Progress Notes (Signed)
Patient presents to clinic today to establish care.  Patient with recent history of episodes of unspecified chest pain.  Has had two ER workups that were unremarkable except for LVH noted on EKG.  Was started on Prilosec daily.  Has been taking as directed with some noted improvement in symptoms.  Still having some evening heartburn.  Denies abdominal pain, nausea or vomiting.  Denies hx of hypertension or hyperlipidemia. Denies SOB, lightheadedness or dizziness.  Was instructed by ER that he would need to see a Cardiologist, but referral was not placed.  Patient denies recurrence of chest pain but endorses significant anxiety and worry, wondering what is the cause of his symptoms.  Denies panic attack or depressed mood.  Health Maintenance: Dental -- overdue Vision -- overdue Immunizations -- Last Tetanus in 2008.  Declines flu shot.  Past Medical History  Diagnosis Date  . History of chicken pox   . GERD (gastroesophageal reflux disease)   . Ingrown toenail     Past Surgical History  Procedure Laterality Date  . No past surgeries      Current Outpatient Prescriptions on File Prior to Visit  Medication Sig Dispense Refill  . aspirin 325 MG tablet Take 325 mg by mouth daily as needed for mild pain or moderate pain.    Marland Kitchen ibuprofen (ADVIL,MOTRIN) 200 MG tablet Take 200 mg by mouth every 6 (six) hours as needed for mild pain or moderate pain.    Marland Kitchen omeprazole (PRILOSEC) 20 MG capsule Take one capsule PO twice a day for 3 days, then one capsule PO once a day (Patient taking differently: Take 20 mg by mouth daily. Take one capsule PO twice a day for 3 days, then one capsule PO once a day) 20 capsule 0   No current facility-administered medications on file prior to visit.    No Known Allergies  Family History  Problem Relation Age of Onset  . Diabetes Other     Paternal side  . Healthy Mother     Living  . Healthy Father     Living  . Diabetes Brother     x1  . Healthy Sister      x2  . Healthy Son     x1  . Healthy Daughter     x1    History   Social History  . Marital Status: Married    Spouse Name: N/A  . Number of Children: N/A  . Years of Education: N/A   Occupational History  . Not on file.   Social History Main Topics  . Smoking status: Never Smoker   . Smokeless tobacco: Never Used  . Alcohol Use: 0.0 oz/week    0 Standard drinks or equivalent per week     Comment: rare  . Drug Use: No  . Sexual Activity:    Partners: Female   Other Topics Concern  . Not on file   Social History Narrative   ROS Pertinent ROS are listed in the HPI.  BP 139/84 mmHg  Pulse 93  Temp(Src) 99 F (37.2 C) (Oral)  Resp 16  Ht 6' (1.829 m)  Wt 251 lb (113.853 kg)  BMI 34.03 kg/m2  SpO2 98%  Physical Exam  Constitutional: He is oriented to person, place, and time and well-developed, well-nourished, and in no distress.  HENT:  Head: Normocephalic and atraumatic.  Eyes: Conjunctivae are normal.  Neck: Neck supple. No thyromegaly present.  Cardiovascular: Normal rate, regular rhythm, normal heart sounds and intact distal  pulses.   Pulmonary/Chest: Effort normal and breath sounds normal. No respiratory distress. He has no wheezes. He has no rales. He exhibits no tenderness.  Lymphadenopathy:    He has no cervical adenopathy.  Neurological: He is alert and oriented to person, place, and time.  Skin: Skin is warm and dry. No rash noted.  Psychiatric: Affect normal.  Vitals reviewed.   Recent Results (from the past 2160 hour(s))  CBC     Status: Abnormal   Collection Time: 10/30/14  7:00 PM  Result Value Ref Range   WBC 16.9 (H) 4.0 - 10.5 K/uL   RBC 5.31 4.22 - 5.81 MIL/uL   Hemoglobin 16.0 13.0 - 17.0 g/dL   HCT 45.8 39.0 - 52.0 %   MCV 86.3 78.0 - 100.0 fL   MCH 30.1 26.0 - 34.0 pg   MCHC 34.9 30.0 - 36.0 g/dL   RDW 12.6 11.5 - 15.5 %   Platelets 250 150 - 400 K/uL  Basic metabolic panel     Status: Abnormal   Collection Time: 10/30/14   7:00 PM  Result Value Ref Range   Sodium 136 135 - 145 mmol/L    Comment: Please note change in reference range.   Potassium 3.7 3.5 - 5.1 mmol/L    Comment: Please note change in reference range.   Chloride 99 96 - 112 mEq/L   CO2 25 19 - 32 mmol/L   Glucose, Bld 119 (H) 70 - 99 mg/dL   BUN 10 6 - 23 mg/dL   Creatinine, Ser 1.10 0.50 - 1.35 mg/dL   Calcium 9.3 8.4 - 10.5 mg/dL   GFR calc non Af Amer 89 (L) >90 mL/min   GFR calc Af Amer >90 >90 mL/min    Comment: (NOTE) The eGFR has been calculated using the CKD EPI equation. This calculation has not been validated in all clinical situations. eGFR's persistently <90 mL/min signify possible Chronic Kidney Disease.    Anion gap 12 5 - 15  I-stat troponin, ED (not at [facility])     Status: None   Collection Time: 10/30/14  7:17 PM  Result Value Ref Range   Troponin i, poc 0.00 0.00 - 0.08 ng/mL   Comment 3            Comment: Due to the release kinetics of cTnI, a negative result within the first hours of the onset of symptoms does not rule out myocardial infarction with certainty. If myocardial infarction is still suspected, repeat the test at appropriate intervals.   D-dimer, quantitative     Status: None   Collection Time: 10/30/14  8:47 PM  Result Value Ref Range   D-Dimer, Quant <0.27 0.00 - 0.48 ug/mL-FEU    Comment:        AT THE INHOUSE ESTABLISHED CUTOFF VALUE OF 0.48 ug/mL FEU, THIS ASSAY HAS BEEN DOCUMENTED IN THE LITERATURE TO HAVE A SENSITIVITY AND NEGATIVE PREDICTIVE VALUE OF AT LEAST 98 TO 99%.  THE TEST RESULT SHOULD BE CORRELATED WITH AN ASSESSMENT OF THE CLINICAL PROBABILITY OF DVT / VTE.   TSH     Status: None   Collection Time: 10/30/14  9:22 PM  Result Value Ref Range   TSH 0.961 0.350 - 4.500 uIU/mL  T4, free     Status: None   Collection Time: 10/30/14  9:22 PM  Result Value Ref Range   Free T4 1.17 0.80 - 1.80 ng/dL    Comment: Performed at [facility]  I-stat troponin, ED  Status:  None   Collection Time: 10/30/14 11:26 PM  Result Value Ref Range   Troponin i, poc 0.00 0.00 - 0.08 ng/mL   Comment 3            Comment: Due to the release kinetics of cTnI, a negative result within the first hours of the onset of symptoms does not rule out myocardial infarction with certainty. If myocardial infarction is still suspected, repeat the test at appropriate intervals.   CBC     Status: None   Collection Time: 11/22/14  8:30 AM  Result Value Ref Range   WBC 6.9 4.0 - 10.5 K/uL   RBC 5.05 4.22 - 5.81 MIL/uL   Hemoglobin 15.2 13.0 - 17.0 g/dL   HCT 44.1 39.0 - 52.0 %   MCV 87.3 78.0 - 100.0 fL   MCH 30.1 26.0 - 34.0 pg   MCHC 34.5 30.0 - 36.0 g/dL   RDW 12.7 11.5 - 15.5 %   Platelets 246 150 - 400 K/uL  Basic metabolic panel     Status: Abnormal   Collection Time: 11/22/14  8:30 AM  Result Value Ref Range   Sodium 141 135 - 145 mmol/L   Potassium 3.8 3.5 - 5.1 mmol/L   Chloride 104 96 - 112 mmol/L   CO2 25 19 - 32 mmol/L   Glucose, Bld 103 (H) 70 - 99 mg/dL   BUN 11 6 - 23 mg/dL   Creatinine, Ser 1.08 0.50 - 1.35 mg/dL   Calcium 9.7 8.4 - 10.5 mg/dL   GFR calc non Af Amer >90 >90 mL/min   GFR calc Af Amer >90 >90 mL/min    Comment: (NOTE) The eGFR has been calculated using the CKD EPI equation. This calculation has not been validated in all clinical situations. eGFR's persistently <90 mL/min signify possible Chronic Kidney Disease.    Anion gap 12 5 - 15  I-stat troponin, ED (not at [facility])     Status: None   Collection Time: 11/22/14  8:36 AM  Result Value Ref Range   Troponin i, poc 0.00 0.00 - 0.08 ng/mL   Comment 3            Comment: Due to the release kinetics of cTnI, a negative result within the first hours of the onset of symptoms does not rule out myocardial infarction with certainty. If myocardial infarction is still suspected, repeat the test at appropriate intervals.   I-stat troponin, ED     Status: None   Collection Time: 11/22/14 11:35  AM  Result Value Ref Range   Troponin i, poc 0.00 0.00 - 0.08 ng/mL   Comment 3            Comment: Due to the release kinetics of cTnI, a negative result within the first hours of the onset of symptoms does not rule out myocardial infarction with certainty. If myocardial infarction is still suspected, repeat the test at appropriate intervals.     Assessment/Plan: Anxiety state Will begin Lexapro 5 mg nightly x 1 week, then increase to 10 mg nightly.  Valium 2 mg as needed for acute anxiety.   Pain in the chest Asymptomatic at present.  Suspect likely GERD in nature giving improvement with PPI therapy.  Will add-on Zantac nightly. GERD diet discussed. Giving LVH noted on EKG, will refer to cardiology for further assessment and echocardiogram. Alarm signs/symptoms discussed with patient.  Follow-up in 3-4 weeks.

## 2014-11-27 NOTE — Progress Notes (Signed)
Pre visit review using our clinic review tool, if applicable. No additional management support is needed unless otherwise documented below in the visit note/SLS  

## 2014-12-22 ENCOUNTER — Encounter: Payer: Self-pay | Admitting: Internal Medicine

## 2014-12-22 ENCOUNTER — Ambulatory Visit (INDEPENDENT_AMBULATORY_CARE_PROVIDER_SITE_OTHER): Payer: 59 | Admitting: Internal Medicine

## 2014-12-22 VITALS — BP 132/84 | HR 76 | Ht 72.0 in | Wt 248.9 lb

## 2014-12-22 DIAGNOSIS — R079 Chest pain, unspecified: Secondary | ICD-10-CM

## 2014-12-22 DIAGNOSIS — R9431 Abnormal electrocardiogram [ECG] [EKG]: Secondary | ICD-10-CM

## 2014-12-22 DIAGNOSIS — I517 Cardiomegaly: Secondary | ICD-10-CM

## 2014-12-22 NOTE — Patient Instructions (Signed)
Your physician recommends that you schedule a follow-up appointment in: After Echo  Your physician has requested that you have an echocardiogram. Echocardiography is a painless test that uses sound waves to create images of your heart. It provides your doctor with information about the size and shape of your heart and how well your heart's chambers and valves are working. This procedure takes approximately one hour. There are no restrictions for this procedure.   Your physician has recommended you make the following change in your medication: Start 600 mg Ibuprofen twice a day for 1 week.Marland Kitchen

## 2014-12-25 DIAGNOSIS — R9431 Abnormal electrocardiogram [ECG] [EKG]: Secondary | ICD-10-CM | POA: Insufficient documentation

## 2014-12-25 DIAGNOSIS — I517 Cardiomegaly: Secondary | ICD-10-CM | POA: Insufficient documentation

## 2014-12-25 NOTE — Progress Notes (Signed)
OFFICE NOTE  Chief Complaint:  Chest pain  Primary Care Physician: Leeanne Rio, PA-C  HPI:  Maurice Jimenez is a pleasant 29 year old male who is currently referred to me for evaluation of chest pain. His past medical history significant for anxiety and he reports a constant, sharp chest pain. In addition he's had some racing of his heart which has been going on for about a month and a half. None of his symptoms are associated with exertion or relieved by rest. He has no other significant cardiac risk factors. He does have a history of reflux and is been on Prilosec for that. He did have an EKG performed both in his primary office and in our office which is mildly abnormal demonstrating a sinus rhythm with voltage criteria for LVH and nonspecific T-wave changes. There is no significant family history for coronary disease. He also reports a soreness in his chest wall which is worse when moving his shoulder and arm and is clearly reproducible.  PMHx:  Past Medical History  Diagnosis Date  . History of chicken pox   . GERD (gastroesophageal reflux disease)   . Ingrown toenail     Past Surgical History  Procedure Laterality Date  . No past surgeries      FAMHx:  Family History  Problem Relation Age of Onset  . Diabetes Other     Paternal side  . Healthy Mother     Living  . Healthy Father     Living  . Diabetes Brother     x1  . Healthy Sister     x2  . Healthy Son     x1  . Healthy Daughter     x1    SOCHx:   reports that he has never smoked. He has never used smokeless tobacco. He reports that he drinks alcohol. He reports that he does not use illicit drugs.  ALLERGIES:  No Known Allergies  ROS: A comprehensive review of systems was negative except for: Cardiovascular: positive for chest pain and palpitations  HOME MEDS: Current Outpatient Prescriptions  Medication Sig Dispense Refill  . aspirin 325 MG tablet Take 325 mg by mouth daily as needed for  mild pain or moderate pain.    . diazepam (VALIUM) 2 MG tablet Take 1 tablet (2 mg total) by mouth every 6 (six) hours as needed for anxiety. 30 tablet 0  . escitalopram (LEXAPRO) 10 MG tablet Take 1/2 tablet at bedtime for 1 week.  Then increase to 1 tablet at bedtime. 30 tablet 1  . ibuprofen (ADVIL,MOTRIN) 200 MG tablet Take 200 mg by mouth every 6 (six) hours as needed for mild pain or moderate pain.    Marland Kitchen omeprazole (PRILOSEC) 20 MG capsule Take one capsule PO twice a day for 3 days, then one capsule PO once a day (Patient taking differently: Take 20 mg by mouth daily. Take one capsule PO twice a day for 3 days, then one capsule PO once a day) 20 capsule 0  . ranitidine (ZANTAC) 150 MG tablet Take 1 tablet (150 mg total) by mouth at bedtime. 30 tablet 1   No current facility-administered medications for this visit.    LABS/IMAGING: No results found for this or any previous visit (from the past 48 hour(s)). No results found.  VITALS: BP 132/84 mmHg  Pulse 76  Ht 6' (1.829 m)  Wt 248 lb 14.4 oz (112.9 kg)  BMI 33.75 kg/m2  EXAM: General appearance: alert and no distress Neck:  no carotid bruit and no JVD Lungs: clear to auscultation bilaterally and Pain on palpation over the left fourth rib margin Heart: regular rate and rhythm, S1, S2 normal, no murmur, click, rub or gallop Abdomen: soft, non-tender; bowel sounds normal; no masses,  no organomegaly Extremities: extremities normal, atraumatic, no cyanosis or edema Pulses: 2+ and symmetric Skin: Skin color, texture, turgor normal. No rashes or lesions Neurologic: Grossly normal Psych: Mildly anxious  EKG: Normal sinus rhythm with sinus arrhythmia at 76, voltage criteria for LVH, nonspecific T-wave changes  ASSESSMENT: 1. Atypical chest pain 2. Voltage criteria for LVH 3. Anxiety 4. GERD 5. Questionable palpitations  PLAN: 1.   Mr. Nardozzi is describing atypical chest wall pain which is reproducible and worse with movement,  but not exertion. Suspect that this is due to intercostal inflammation or costochondritis. I think he benefit from a short course of anti-inflammatories. I recommend ibuprofen 600 mg twice daily for week. He does have some EKG changes suggestive of LVH. It may be worthwhile to further evaluate this and rule out a cardiomyopathy or other causes of LVH. He does not have a history of hypertension. It would also serve to demonstrate he has normal wall motion which would further support the diagnosis of atypical chest pain. It's unclear if his palpitations are related to anxiety, but I suspect they are. Plan to see him back to discuss results of his echocardiogram and see if he's had improvement in his symptoms with the ibuprofen.  Thanks for the kind referral.  Pixie Casino, MD, [facility] Attending Cardiologist [facility]  HILTY,Kenneth C 12/25/2014, 6:48 PM

## 2014-12-28 ENCOUNTER — Ambulatory Visit (HOSPITAL_COMMUNITY)
Admission: RE | Admit: 2014-12-28 | Discharge: 2014-12-28 | Disposition: A | Discharge status: Home / Self Care | Payer: 59 | Source: Ambulatory Visit | Attending: Cardiology | Admitting: Cardiology

## 2014-12-28 DIAGNOSIS — K219 Gastro-esophageal reflux disease without esophagitis: Secondary | ICD-10-CM | POA: Insufficient documentation

## 2014-12-28 DIAGNOSIS — R9431 Abnormal electrocardiogram [ECG] [EKG]: Secondary | ICD-10-CM | POA: Insufficient documentation

## 2014-12-28 DIAGNOSIS — R002 Palpitations: Secondary | ICD-10-CM | POA: Diagnosis not present

## 2014-12-28 DIAGNOSIS — I517 Cardiomegaly: Secondary | ICD-10-CM | POA: Insufficient documentation

## 2014-12-28 DIAGNOSIS — R079 Chest pain, unspecified: Secondary | ICD-10-CM | POA: Diagnosis not present

## 2014-12-28 NOTE — Progress Notes (Signed)
2D Echocardiogram Complete.  12/28/2014   Maurice Jimenez [place], [place]

## 2014-12-30 ENCOUNTER — Other Ambulatory Visit: Payer: Self-pay | Admitting: Physician Assistant

## 2014-12-30 DIAGNOSIS — F411 Generalized anxiety disorder: Secondary | ICD-10-CM

## 2015-01-01 ENCOUNTER — Encounter: Payer: Self-pay | Admitting: Physician Assistant

## 2015-01-01 MED ORDER — DIAZEPAM 2 MG PO TABS: 2.0000 mg | ORAL_TABLET | Freq: Four times a day (QID) | ORAL | Status: DC | PRN

## 2015-01-01 NOTE — Telephone Encounter (Signed)
Refill faxed to pharmacy.

## 2015-02-09 ENCOUNTER — Ambulatory Visit: Payer: Self-pay | Admitting: Internal Medicine

## 2015-02-24 ENCOUNTER — Encounter: Payer: Self-pay | Admitting: Physician Assistant

## 2015-02-24 DIAGNOSIS — F411 Generalized anxiety disorder: Secondary | ICD-10-CM

## 2015-02-25 MED ORDER — ESCITALOPRAM OXALATE 10 MG PO TABS: ORAL_TABLET | ORAL | Status: DC

## 2015-02-25 NOTE — Telephone Encounter (Signed)
See [place].  Lexapro refilled.

## 2015-05-21 ENCOUNTER — Encounter: Payer: Self-pay | Admitting: Physician Assistant

## 2015-05-21 ENCOUNTER — Ambulatory Visit (INDEPENDENT_AMBULATORY_CARE_PROVIDER_SITE_OTHER): Payer: 59 | Admitting: Physician Assistant

## 2015-05-21 VITALS — BP 130/88 | HR 98 | Temp 98.2°F | Ht 72.0 in | Wt 274.0 lb

## 2015-05-21 DIAGNOSIS — F411 Generalized anxiety disorder: Secondary | ICD-10-CM | POA: Diagnosis not present

## 2015-05-21 MED ORDER — FLUOXETINE HCL 20 MG PO TABS: 20.0000 mg | ORAL_TABLET | Freq: Every day | ORAL | Status: DC

## 2015-05-21 NOTE — Progress Notes (Signed)
    Patient presents to clinic today for follow-up of anxiety state. Patient previously on Lexapro 10 mg. Endorses some improvement with symptoms but noticed weight gain. Stopped medications about 2 weeks ago. Has noted gradual worsening of anxiety, requiring use of valium. Denies panic attack. Denies depressed mood or SI/HI. Wishes to discuss other medication options.  Past Medical History  Diagnosis Date  . History of chicken pox   . GERD (gastroesophageal reflux disease)   . Ingrown toenail     Current Outpatient Prescriptions on File Prior to Visit  Medication Sig Dispense Refill  . aspirin 325 MG tablet Take 325 mg by mouth daily as needed for mild pain or moderate pain.    . diazepam (VALIUM) 2 MG tablet Take 1 tablet (2 mg total) by mouth every 6 (six) hours as needed for anxiety. 30 tablet 2  . ibuprofen (ADVIL,MOTRIN) 200 MG tablet Take 200 mg by mouth every 6 (six) hours as needed for mild pain or moderate pain.     No current facility-administered medications on file prior to visit.    No Known Allergies  Family History  Problem Relation Age of Onset  . Diabetes Other     Paternal side  . Healthy Mother     Living  . Healthy Father     Living  . Diabetes Brother     x1  . Healthy Sister     x2  . Healthy Son     x1  . Healthy Daughter     x1    History   Social History  . Marital Status: Married    Spouse Name: N/A  . Number of Children: N/A  . Years of Education: N/A   Social History Main Topics  . Smoking status: Never Smoker   . Smokeless tobacco: Never Used  . Alcohol Use: 0.0 oz/week    0 Standard drinks or equivalent per week     Comment: rare  . Drug Use: No  . Sexual Activity:    Partners: Female   Other Topics Concern  . None   Social History Narrative   Review of Systems - See HPI.  All other ROS are negative.  BP 130/88 mmHg  Pulse 98  Temp(Src) 98.2 F (36.8 C) (Oral)  Ht 6' (1.829 m)  Wt 274 lb (124.286 kg)  BMI 37.15  kg/m2  SpO2 98%  Physical Exam  Constitutional: He is oriented to person, place, and time and well-developed, well-nourished, and in no distress.  HENT:  Head: Normocephalic and atraumatic.  Eyes: Conjunctivae are normal.  Cardiovascular: Normal rate, regular rhythm, normal heart sounds and intact distal pulses.   Pulmonary/Chest: Effort normal and breath sounds normal. No respiratory distress. He has no wheezes. He has no rales. He exhibits no tenderness.  Neurological: He is alert and oriented to person, place, and time.  Skin: Skin is warm and dry. No rash noted.  Psychiatric: His mood appears anxious.  Vitals reviewed.   No results found for this or any previous visit (from the past 2160 hour(s)).  Assessment/Plan: Anxiety state Discussed weight gain with Lexapro compared to other SSRIs/SNRIs/TCAs. Will begin Fluoxetine 20 mg -- 10 mg QD x 3 days before titrating to 20 mg. Follow-up 1 month.

## 2015-05-21 NOTE — Assessment & Plan Note (Signed)
Discussed weight gain with Lexapro compared to other SSRIs/SNRIs/TCAs. Will begin Fluoxetine 20 mg -- 10 mg QD x 3 days before titrating to 20 mg. Follow-up 1 month.

## 2015-05-21 NOTE — Progress Notes (Signed)
Pre visit review using our clinic review tool, if applicable. No additional management support is needed unless otherwise documented below in the visit note. 

## 2015-05-21 NOTE — Patient Instructions (Signed)
Please start the Fluoxetine taking 1/2 tablet daily x 3 days. Then increase to 1 tablet. I would recommend taking at bedtime as it may help with sleep. Continue Valium as needed for acute anxiety. A refill has been sent in.  Follow-up in 4-6 weeks.

## 2015-05-31 ENCOUNTER — Other Ambulatory Visit: Payer: Self-pay | Admitting: Physician Assistant

## 2015-06-02 ENCOUNTER — Encounter: Payer: Self-pay | Admitting: Physician Assistant

## 2015-06-04 NOTE — Telephone Encounter (Signed)
I refilled patient's Valium last week. He is sending a MyChart request for a refill. Can you check with pharmacy to make sure they have received the medication?

## 2015-06-20 ENCOUNTER — Ambulatory Visit: Payer: 59 | Admitting: Physician Assistant

## 2015-06-27 ENCOUNTER — Ambulatory Visit: Payer: 59 | Admitting: Physician Assistant

## 2015-07-04 ENCOUNTER — Encounter: Payer: Self-pay | Admitting: Physician Assistant

## 2015-07-04 ENCOUNTER — Ambulatory Visit (INDEPENDENT_AMBULATORY_CARE_PROVIDER_SITE_OTHER): Payer: 59 | Admitting: Physician Assistant

## 2015-07-04 VITALS — BP 126/84 | HR 99 | Temp 98.7°F | Resp 16 | Ht 72.0 in | Wt 272.2 lb

## 2015-07-04 DIAGNOSIS — F411 Generalized anxiety disorder: Secondary | ICD-10-CM

## 2015-07-04 NOTE — Assessment & Plan Note (Signed)
Marked improvement. Continue current regimen. Follow-up in 3 months.

## 2015-07-04 NOTE — Progress Notes (Signed)
Pre visit review using our clinic review tool, if applicable. No additional management support is needed unless otherwise documented below in the visit note/SLS  

## 2015-07-04 NOTE — Progress Notes (Signed)
    Patient presents to clinic today for follow-up of anxiety after starting Prozac 20 mg daily. Endorses taking as directed. Endorses mood is much better with anxiety being controlled. Is sleeping well. Denies weight gain.   Past Medical History  Diagnosis Date  . History of chicken pox   . GERD (gastroesophageal reflux disease)   . Ingrown toenail     Current Outpatient Prescriptions on File Prior to Visit  Medication Sig Dispense Refill  . aspirin 325 MG tablet Take 325 mg by mouth daily as needed for mild pain or moderate pain.    . diazepam (VALIUM) 2 MG tablet TAKE 1 TABLET BY MOUTH EVERY 6 HOURS AS NEEDED FOR ANXIETY 30 tablet 1  . FLUoxetine (PROZAC) 20 MG tablet Take 1 tablet (20 mg total) by mouth daily. 30 tablet 3  . ibuprofen (ADVIL,MOTRIN) 200 MG tablet Take 200 mg by mouth every 6 (six) hours as needed for mild pain or moderate pain.     No current facility-administered medications on file prior to visit.    No Known Allergies  Family History  Problem Relation Age of Onset  . Diabetes Other     Paternal side  . Healthy Mother     Living  . Healthy Father     Living  . Diabetes Brother     x1  . Healthy Sister     x2  . Healthy Son     x1  . Healthy Daughter     x1    Social History   Social History  . Marital Status: Married    Spouse Name: N/A  . Number of Children: N/A  . Years of Education: N/A   Social History Main Topics  . Smoking status: Never Smoker   . Smokeless tobacco: Never Used  . Alcohol Use: 0.0 oz/week    0 Standard drinks or equivalent per week     Comment: rare  . Drug Use: No  . Sexual Activity:    Partners: Female   Other Topics Concern  . None   Social History Narrative    Review of Systems - See HPI.  All other ROS are negative.  BP 126/84 mmHg  Pulse 99  Temp(Src) 98.7 F (37.1 C) (Oral)  Resp 16  Ht 6' (1.829 m)  Wt 272 lb 4 oz (123.492 kg)  BMI 36.92 kg/m2  SpO2 98%  Physical Exam  Constitutional: He  is oriented to person, place, and time and well-developed, well-nourished, and in no distress.  HENT:  Head: Normocephalic and atraumatic.  Eyes: Conjunctivae are normal.  Cardiovascular: Normal rate, regular rhythm, normal heart sounds and intact distal pulses.   Pulmonary/Chest: Effort normal and breath sounds normal. No respiratory distress. He has no wheezes. He has no rales. He exhibits no tenderness.  Neurological: He is alert and oriented to person, place, and time.  Skin: Skin is warm and dry. No rash noted.  Psychiatric: Affect normal.  Vitals reviewed.   No results found for this or any previous visit (from the past 2160 hour(s)).  Assessment/Plan: Anxiety state Marked improvement. Continue current regimen. Follow-up in 3 months.

## 2015-07-04 NOTE — Patient Instructions (Signed)
Please continue the Prozac as directed. Stay well hydrated and stay active. I am glad you are doing much better.  Follow-up with me in 3 months. Return sooner if you need [place].

## 2015-09-05 ENCOUNTER — Ambulatory Visit: Payer: Self-pay | Admitting: Physician Assistant

## 2015-09-05 ENCOUNTER — Encounter: Payer: Self-pay | Admitting: Physician Assistant

## 2015-09-05 ENCOUNTER — Ambulatory Visit (INDEPENDENT_AMBULATORY_CARE_PROVIDER_SITE_OTHER): Payer: 59 | Admitting: Physician Assistant

## 2015-09-05 VITALS — BP 134/98 | HR 74 | Temp 98.7°F | Resp 16 | Ht 72.0 in | Wt 269.1 lb

## 2015-09-05 DIAGNOSIS — J019 Acute sinusitis, unspecified: Secondary | ICD-10-CM

## 2015-09-05 DIAGNOSIS — B9689 Other specified bacterial agents as the cause of diseases classified elsewhere: Secondary | ICD-10-CM | POA: Insufficient documentation

## 2015-09-05 MED ORDER — AMOXICILLIN-POT CLAVULANATE 875-125 MG PO TABS: 1.0000 | ORAL_TABLET | Freq: Two times a day (BID) | ORAL | Status: DC

## 2015-09-05 NOTE — Progress Notes (Signed)
Patient presents to clinic today c/o 1 month of sinus pressure, sinus pain, PND with intermittent ST, productive cough and fatigue. Denies fever, chills, shortness of breath. Denies recent travel or sick contact. Has been taking Dayquil with little relief of symptoms.  Patient also complains of a "cyst" noted on left upper arm x 2 years. Endorses area has been growing and has become tender over the past couple of months. Denies drainage, warmth or erythema.  Past Medical History  Diagnosis Date  . History of chicken pox   . GERD (gastroesophageal reflux disease)   . Ingrown toenail     Current Outpatient Prescriptions on File Prior to Visit  Medication Sig Dispense Refill  . aspirin 325 MG tablet Take 325 mg by mouth daily as needed for mild pain or moderate pain.    . diazepam (VALIUM) 2 MG tablet TAKE 1 TABLET BY MOUTH EVERY 6 HOURS AS NEEDED FOR ANXIETY 30 tablet 1  . FLUoxetine (PROZAC) 20 MG tablet Take 1 tablet (20 mg total) by mouth daily. 30 tablet 3  . ibuprofen (ADVIL,MOTRIN) 200 MG tablet Take 200 mg by mouth every 6 (six) hours as needed for mild pain or moderate pain.     No current facility-administered medications on file prior to visit.    No Known Allergies  Family History  Problem Relation Age of Onset  . Diabetes Other     Paternal side  . Healthy Mother     Living  . Healthy Father     Living  . Diabetes Brother     x1  . Healthy Sister     x2  . Healthy Son     x1  . Healthy Daughter     x1    Social History   Social History  . Marital Status: Married    Spouse Name: N/A  . Number of Children: N/A  . Years of Education: N/A   Social History Main Topics  . Smoking status: Never Smoker   . Smokeless tobacco: Never Used  . Alcohol Use: 0.0 oz/week    0 Standard drinks or equivalent per week     Comment: rare  . Drug Use: No  . Sexual Activity:    Partners: Female   Other Topics Concern  . None   Social History Narrative   Review of  Systems - See HPI.  All other ROS are negative.  BP 134/98 mmHg  Pulse 74  Temp(Src) 98.7 F (37.1 C) (Oral)  Resp 16  Ht 6' (1.829 m)  Wt 269 lb 2 oz (122.074 kg)  BMI 36.49 kg/m2  SpO2 98%  Physical Exam  Constitutional: He is oriented to person, place, and time and well-developed, well-nourished, and in no distress.  HENT:  Head: Normocephalic and atraumatic.  Right Ear: External ear normal.  Left Ear: External ear normal.  Nose: Nose normal.  Mouth/Throat: Oropharynx is clear and moist. No oropharyngeal exudate.  + TTP of sinuses  Eyes: Conjunctivae are normal. Pupils are equal, round, and reactive to light.  Cardiovascular: Normal rate, regular rhythm, normal heart sounds and intact distal pulses.   Pulmonary/Chest: Effort normal and breath sounds normal. No respiratory distress. He has no wheezes. He has no rales. He exhibits no tenderness.  Neurological: He is alert and oriented to person, place, and time.  Skin: Skin is warm and dry. No rash noted.  Psychiatric: Affect normal.  Vitals reviewed.   No results found for this or any previous visit (from  the past 2160 hour(s)).  Assessment/Plan: Acute bacterial sinusitis Rx Augmentin.  Increase fluids.  Rest.  Saline nasal spray.  Probiotic.  Mucinex as directed.  Humidifier in bedroom.  Call or return to clinic if symptoms are not improving.

## 2015-09-05 NOTE — Assessment & Plan Note (Signed)
Rx Augmentin.  Increase fluids.  Rest.  Saline nasal spray.  Probiotic.  Mucinex as directed.  Humidifier in bedroom.  Call or return to clinic if symptoms are not improving.  

## 2015-09-05 NOTE — Patient Instructions (Addendum)
Please take antibiotic as directed.  Increase fluid intake.  Use Saline nasal spray.  Take a daily multivitamin.  Place a humidifier in the bedroom.  Please call or return clinic if symptoms are not improving.  Let me know if you would like me to set you up with Dermatology to remove the cyst.  Sinusitis Sinusitis is redness, soreness, and swelling (inflammation) of the paranasal sinuses. Paranasal sinuses are air pockets within the bones of your face (beneath the eyes, the middle of the forehead, or above the eyes). In healthy paranasal sinuses, mucus is able to drain out, and air is able to circulate through them by way of your nose. However, when your paranasal sinuses are inflamed, mucus and air can become trapped. This can allow bacteria and other germs to grow and cause infection. Sinusitis can develop quickly and last only a short time (acute) or continue over a long period (chronic). Sinusitis that lasts for more than 12 weeks is considered chronic.  CAUSES  Causes of sinusitis include:  Allergies.  Structural abnormalities, such as displacement of the cartilage that separates your nostrils (deviated septum), which can decrease the air flow through your nose and sinuses and affect sinus drainage.  Functional abnormalities, such as when the small hairs (cilia) that line your sinuses and help remove mucus do not work properly or are not present. SYMPTOMS  Symptoms of acute and chronic sinusitis are the same. The primary symptoms are pain and pressure around the affected sinuses. Other symptoms include:  Upper toothache.  Earache.  Headache.  Bad breath.  Decreased sense of smell and taste.  A cough, which worsens when you are lying flat.  Fatigue.  Fever.  Thick drainage from your nose, which often is green and may contain pus (purulent).  Swelling and warmth over the affected sinuses. DIAGNOSIS  Your caregiver will perform a physical exam. During the exam, your caregiver  may:  Look in your nose for signs of abnormal growths in your nostrils (nasal polyps).  Tap over the affected sinus to check for signs of infection.  View the inside of your sinuses (endoscopy) with a special imaging device with a light attached (endoscope), which is inserted into your sinuses. If your caregiver suspects that you have chronic sinusitis, one or more of the following tests may be recommended:  Allergy tests.  Nasal culture A sample of mucus is taken from your nose and sent to a lab and screened for bacteria.  Nasal cytology A sample of mucus is taken from your nose and examined by your caregiver to determine if your sinusitis is related to an allergy. TREATMENT  Most cases of acute sinusitis are related to a viral infection and will resolve on their own within 10 days. Sometimes medicines are prescribed to help relieve symptoms (pain medicine, decongestants, nasal steroid sprays, or saline sprays).  However, for sinusitis related to a bacterial infection, your caregiver will prescribe antibiotic medicines. These are medicines that will help kill the bacteria causing the infection.  Rarely, sinusitis is caused by a fungal infection. In theses cases, your caregiver will prescribe antifungal medicine. For some cases of chronic sinusitis, surgery is needed. Generally, these are cases in which sinusitis recurs more than 3 times per year, despite other treatments. HOME CARE INSTRUCTIONS   Drink plenty of water. Water helps thin the mucus so your sinuses can drain more easily.  Use a humidifier.  Inhale steam 3 to 4 times a day (for example, sit in the bathroom  with the shower running).  Apply a warm, moist washcloth to your face 3 to 4 times a day, or as directed by your caregiver.  Use saline nasal sprays to help moisten and clean your sinuses.  Take over-the-counter or prescription medicines for pain, discomfort, or fever only as directed by your caregiver. SEEK IMMEDIATE  MEDICAL CARE IF:  You have increasing pain or severe headaches.  You have nausea, vomiting, or drowsiness.  You have swelling around your face.  You have vision problems.  You have a stiff neck.  You have difficulty breathing. MAKE SURE YOU:   Understand these instructions.  Will watch your condition.  Will get help right away if you are not doing well or get worse. Document Released: 09/29/2005 Document Revised: 12/22/2011 Document Reviewed: 10/14/2011 [facility] Patient Information 2014 [place], [place].

## 2015-09-05 NOTE — Progress Notes (Signed)
Pre visit review using our clinic review tool, if applicable. No additional management support is needed unless otherwise documented below in the visit note/SLS  

## 2015-09-15 ENCOUNTER — Encounter: Payer: Self-pay | Admitting: Physician Assistant

## 2015-09-18 ENCOUNTER — Encounter: Payer: Self-pay | Admitting: Physician Assistant

## 2015-09-18 DIAGNOSIS — F411 Generalized anxiety disorder: Secondary | ICD-10-CM

## 2015-09-18 MED ORDER — FLUOXETINE HCL 20 MG PO TABS: 20.0000 mg | ORAL_TABLET | Freq: Every day | ORAL | Status: DC

## 2015-09-19 MED ORDER — FLUOXETINE HCL 20 MG PO TABS
20.0000 mg | ORAL_TABLET | Freq: Every day | ORAL | Status: DC
Start: 2015-09-19 — End: 2016-01-21

## 2015-09-19 NOTE — Addendum Note (Signed)
Addended by: Raiford Noble on: 09/19/2015 08:26 AM   Modules accepted: Orders

## 2015-10-03 ENCOUNTER — Ambulatory Visit (INDEPENDENT_AMBULATORY_CARE_PROVIDER_SITE_OTHER): Payer: 59 | Admitting: Physician Assistant

## 2015-10-03 ENCOUNTER — Ambulatory Visit: Payer: 59 | Admitting: Physician Assistant

## 2015-10-03 ENCOUNTER — Encounter: Payer: Self-pay | Admitting: Physician Assistant

## 2015-10-03 ENCOUNTER — Other Ambulatory Visit: Payer: Self-pay | Admitting: Physician Assistant

## 2015-10-03 VITALS — BP 140/98 | HR 94 | Temp 98.2°F | Ht 72.0 in | Wt 272.2 lb

## 2015-10-03 DIAGNOSIS — F411 Generalized anxiety disorder: Secondary | ICD-10-CM | POA: Diagnosis not present

## 2015-10-03 DIAGNOSIS — D367 Benign neoplasm of other specified sites: Secondary | ICD-10-CM

## 2015-10-03 NOTE — Progress Notes (Signed)
Pre visit review using our clinic review tool, if applicable. No additional management support is needed unless otherwise documented below in the visit note. 

## 2015-10-03 NOTE — Progress Notes (Signed)
   Patient presents to clinic today for 3 month follow-up of anxiety. Is taking Fluoxetine daily as directed. Endorses feeling good overall. Has not required use of Valium in 2 weeks. Endorses good sleep. Denies SI/HI or panic attack.  Past Medical History  Diagnosis Date  . History of chicken pox   . GERD (gastroesophageal reflux disease)   . Ingrown toenail     Current Outpatient Prescriptions on File Prior to Visit  Medication Sig Dispense Refill  . aspirin 325 MG tablet Take 325 mg by mouth daily as needed for mild pain or moderate pain.    . diazepam (VALIUM) 2 MG tablet TAKE 1 TABLET BY MOUTH EVERY 6 HOURS AS NEEDED FOR ANXIETY 30 tablet 1  . FLUoxetine (PROZAC) 20 MG tablet Take 1 tablet (20 mg total) by mouth daily. 30 tablet 3  . ibuprofen (ADVIL,MOTRIN) 200 MG tablet Take 200 mg by mouth every 6 (six) hours as needed for mild pain or moderate pain.     No current facility-administered medications on file prior to visit.    No Known Allergies  Family History  Problem Relation Age of Onset  . Diabetes Other     Paternal side  . Healthy Mother     Living  . Healthy Father     Living  . Diabetes Brother     x1  . Healthy Sister     x2  . Healthy Son     x1  . Healthy Daughter     x1    Social History   Social History  . Marital Status: Married    Spouse Name: N/A  . Number of Children: N/A  . Years of Education: N/A   Social History Main Topics  . Smoking status: Never Smoker   . Smokeless tobacco: Never Used  . Alcohol Use: 0.0 oz/week    0 Standard drinks or equivalent per week     Comment: rare  . Drug Use: No  . Sexual Activity:    Partners: Female   Other Topics Concern  . None   Social History Narrative   Review of Systems - See HPI.  All other ROS are negative.  BP 140/98 mmHg  Pulse 94  Temp(Src) 98.2 F (36.8 C) (Oral)  Ht 6' (1.829 m)  Wt 272 lb 3.2 oz (123.469 kg)  BMI 36.91 kg/m2  SpO2 98%  Physical Exam  Constitutional: He  is oriented to person, place, and time and well-developed, well-nourished, and in no distress.  HENT:  Head: Normocephalic and atraumatic.  Cardiovascular: Normal rate, normal heart sounds and intact distal pulses.   Pulmonary/Chest: Effort normal. No respiratory distress. He has no wheezes. He has no rales. He exhibits no tenderness.  Neurological: He is alert and oriented to person, place, and time.  Skin: Skin is warm and dry. No rash noted.  Psychiatric: Affect normal.  Vitals reviewed.   No results found for this or any previous visit (from the past 2160 hour(s)).  Assessment/Plan: Anxiety state Doing very well. Continue current regimen.Follow-up in 6 months.

## 2015-10-03 NOTE — Patient Instructions (Signed)
I am glad you are doing well overall! Please continue the medication as directed. As long as you are doing well, follow-up in 6 months.  You will be contacted by Dermatology for assessment.

## 2015-10-03 NOTE — Assessment & Plan Note (Signed)
Doing very well.  Continue current regimen.  Follow-up in 6 months. 

## 2015-10-12 ENCOUNTER — Encounter: Payer: Self-pay | Admitting: Physician Assistant

## 2015-11-28 ENCOUNTER — Encounter: Payer: Self-pay | Admitting: Physician Assistant

## 2015-11-30 MED ORDER — DIAZEPAM 2 MG PO TABS: 2.0000 mg | ORAL_TABLET | Freq: Four times a day (QID) | ORAL | Status: DC | PRN

## 2016-01-09 ENCOUNTER — Ambulatory Visit: Payer: Self-pay | Admitting: Physician Assistant

## 2016-01-16 ENCOUNTER — Ambulatory Visit: Payer: Self-pay | Admitting: Physician Assistant

## 2016-01-20 ENCOUNTER — Encounter: Payer: Self-pay | Admitting: Physician Assistant

## 2016-01-20 DIAGNOSIS — F411 Generalized anxiety disorder: Secondary | ICD-10-CM

## 2016-01-21 MED ORDER — FLUOXETINE HCL 20 MG PO TABS: 20.0000 mg | ORAL_TABLET | Freq: Every day | ORAL | Status: DC

## 2016-04-01 ENCOUNTER — Encounter: Payer: Self-pay | Admitting: Physician Assistant

## 2016-04-04 ENCOUNTER — Ambulatory Visit: Payer: 59 | Admitting: Physician Assistant

## 2016-04-16 ENCOUNTER — Ambulatory Visit (INDEPENDENT_AMBULATORY_CARE_PROVIDER_SITE_OTHER): Payer: 59 | Admitting: Physician Assistant

## 2016-04-16 ENCOUNTER — Encounter: Payer: Self-pay | Admitting: Physician Assistant

## 2016-04-16 VITALS — BP 128/80 | HR 94 | Temp 98.4°F | Resp 14 | Ht 71.0 in | Wt 277.8 lb

## 2016-04-16 DIAGNOSIS — F411 Generalized anxiety disorder: Secondary | ICD-10-CM | POA: Diagnosis not present

## 2016-04-16 MED ORDER — FLUOXETINE HCL 20 MG PO TABS
20.0000 mg | ORAL_TABLET | Freq: Every day | ORAL | Status: DC
Start: 2016-04-16 — End: 2020-01-19

## 2016-04-16 NOTE — Patient Instructions (Signed)
I have sent in refill of your Fluoxetine to the new pharmacy. Please continue as directed. Follow-up with me in 6 months for a physical.  Return sooner if needed.

## 2016-04-16 NOTE — Progress Notes (Signed)
   Patient presents to clinic today for follow-up on anxiety. Is currently on Fluoxetine 20 mg daily. Is taking as directed. Endorses overall good mood. Some recent anxiety due to mother's health issues. Denies SI/HI. Is on Valium 2 mg as needed. Has only taken on rare occasions, has not used in 1 month.   Past Medical History  Diagnosis Date  . History of chicken pox   . GERD (gastroesophageal reflux disease)   . Ingrown toenail     Current Outpatient Prescriptions on File Prior to Visit  Medication Sig Dispense Refill  . aspirin 325 MG tablet Take 325 mg by mouth daily as needed for mild pain or moderate pain.    . diazepam (VALIUM) 2 MG tablet Take 1 tablet (2 mg total) by mouth every 6 (six) hours as needed. for anxiety 30 tablet 1  . esomeprazole (NEXIUM) 20 MG capsule Take 20 mg by mouth daily at 12 noon.    Marland Kitchen ibuprofen (ADVIL,MOTRIN) 200 MG tablet Take 200 mg by mouth every 6 (six) hours as needed for mild pain or moderate pain.     No current facility-administered medications on file prior to visit.    No Known Allergies  Family History  Problem Relation Age of Onset  . Diabetes Other     Paternal side  . Healthy Mother     Living  . Healthy Father     Living  . Diabetes Brother     x1  . Healthy Sister     x2  . Healthy Son     x1  . Healthy Daughter     x1    Social History   Social History  . Marital Status: Married    Spouse Name: N/A  . Number of Children: N/A  . Years of Education: N/A   Social History Main Topics  . Smoking status: Never Smoker   . Smokeless tobacco: Never Used  . Alcohol Use: 0.0 oz/week    0 Standard drinks or equivalent per week     Comment: rare  . Drug Use: No  . Sexual Activity:    Partners: Female   Other Topics Concern  . None   Social History Narrative   Review of Systems - See HPI.  All other ROS are negative.  BP 128/80 mmHg  Pulse 94  Temp(Src) 98.4 F (36.9 C) (Oral)  Resp 14  Ht 5\' 11"  (1.803 m)  Wt  277 lb 12.8 oz (126.009 kg)  BMI 38.76 kg/m2  SpO2 98%  Physical Exam  Constitutional: He is oriented to person, place, and time and well-developed, well-nourished, and in no distress.  HENT:  Head: Normocephalic and atraumatic.  Eyes: Conjunctivae are normal.  Cardiovascular: Normal rate, regular rhythm, normal heart sounds and intact distal pulses.   Pulmonary/Chest: Effort normal and breath sounds normal. No respiratory distress. He has no wheezes. He has no rales. He exhibits no tenderness.  Neurological: He is alert and oriented to person, place, and time.  Skin: Skin is warm and dry. No rash noted.  Psychiatric: Affect normal.  Vitals reviewed.  Assessment/Plan: Anxiety state Doing very well with current regimen. Will continue. FU 6 months.     Leeanne Rio, PA-C

## 2016-04-17 NOTE — Assessment & Plan Note (Signed)
Doing very well with current regimen. Will continue. FU 6 months.

## 2016-10-15 ENCOUNTER — Ambulatory Visit: Payer: Self-pay | Admitting: Family Medicine

## 2016-10-15 ENCOUNTER — Encounter: Payer: Self-pay | Admitting: Family Medicine

## 2016-10-17 ENCOUNTER — Encounter: Payer: Self-pay | Admitting: Physician Assistant

## 2016-11-21 ENCOUNTER — Telehealth: Payer: Self-pay | Admitting: *Deleted

## 2016-11-24 NOTE — Telephone Encounter (Signed)
Error//AB/CMA 

## 2018-02-25 ENCOUNTER — Encounter: Payer: Self-pay | Admitting: Emergency Medicine

## 2019-12-30 ENCOUNTER — Telehealth: Payer: Self-pay | Admitting: General Practice

## 2019-12-30 NOTE — Telephone Encounter (Signed)
ok 

## 2019-12-30 NOTE — Telephone Encounter (Signed)
See below

## 2019-12-30 NOTE — Telephone Encounter (Signed)
Please advise 

## 2019-12-30 NOTE — Telephone Encounter (Signed)
Caller : Maurice Jimenez  Call Number # [phone]  Wife is an established patient of Doctor Carollee Herter and is requesting to establish her husband as a patient .    Please advise

## 2020-01-18 ENCOUNTER — Other Ambulatory Visit: Payer: Self-pay

## 2020-01-19 ENCOUNTER — Encounter: Payer: Self-pay | Admitting: Family Medicine

## 2020-01-19 ENCOUNTER — Ambulatory Visit: Payer: 59 | Admitting: Family Medicine

## 2020-01-19 ENCOUNTER — Other Ambulatory Visit: Payer: Self-pay

## 2020-01-19 VITALS — BP 138/90 | HR 108 | Temp 97.6°F | Resp 18 | Ht 71.0 in | Wt 293.2 lb

## 2020-01-19 DIAGNOSIS — M545 Low back pain, unspecified: Secondary | ICD-10-CM | POA: Insufficient documentation

## 2020-01-19 DIAGNOSIS — I10 Essential (primary) hypertension: Secondary | ICD-10-CM | POA: Insufficient documentation

## 2020-01-19 DIAGNOSIS — Z833 Family history of diabetes mellitus: Secondary | ICD-10-CM

## 2020-01-19 DIAGNOSIS — M7989 Other specified soft tissue disorders: Secondary | ICD-10-CM | POA: Diagnosis not present

## 2020-01-19 MED ORDER — METHOCARBAMOL 500 MG PO TABS: 500.0000 mg | ORAL_TABLET | Freq: Four times a day (QID) | ORAL | 0 refills | Status: DC

## 2020-01-19 NOTE — Assessment & Plan Note (Signed)
Dash diet  Repeat was better  Recheck 3 months Pt will monitor at home as well

## 2020-01-19 NOTE — Patient Instructions (Signed)
DASH Eating Plan DASH stands for "Dietary Approaches to Stop Hypertension." The DASH eating plan is a healthy eating plan that has been shown to reduce high blood pressure (hypertension). It may also reduce your risk for type 2 diabetes, heart disease, and stroke. The DASH eating plan may also help with weight loss. What are tips for following this plan?  General guidelines  Avoid eating more than 2,300 mg (milligrams) of salt (sodium) a day. If you have hypertension, you may need to reduce your sodium intake to 1,500 mg a day.  Limit alcohol intake to no more than 1 drink a day for nonpregnant women and 2 drinks a day for men. One drink equals 12 oz of beer, 5 oz of wine, or 1 oz of hard liquor.  Work with your health care provider to maintain a healthy body weight or to lose weight. Ask what an ideal weight is for you.  Get at least 30 minutes of exercise that causes your heart to beat faster (aerobic exercise) most days of the week. Activities may include walking, swimming, or biking.  Work with your health care provider or diet and nutrition specialist (dietitian) to adjust your eating plan to your individual calorie needs. Reading food labels   Check food labels for the amount of sodium per serving. Choose foods with less than 5 percent of the Daily Value of sodium. Generally, foods with less than 300 mg of sodium per serving fit into this eating plan.  To find whole grains, look for the word "whole" as the first word in the ingredient list. Shopping  Buy products labeled as "low-sodium" or "no salt added."  Buy fresh foods. Avoid canned foods and premade or frozen meals. Cooking  Avoid adding salt when cooking. Use salt-free seasonings or herbs instead of table salt or sea salt. Check with your health care provider or pharmacist before using salt substitutes.  Do not fry foods. Cook foods using healthy methods such as baking, boiling, grilling, and broiling instead.  Cook with  heart-healthy oils, such as olive, canola, soybean, or sunflower oil. Meal planning  Eat a balanced diet that includes: ? 5 or more servings of fruits and vegetables each day. At each meal, try to fill half of your plate with fruits and vegetables. ? Up to 6-8 servings of whole grains each day. ? Less than 6 oz of lean meat, poultry, or fish each day. A 3-oz serving of meat is about the same size as a deck of cards. One egg equals 1 oz. ? 2 servings of low-fat dairy each day. ? A serving of nuts, seeds, or beans 5 times each week. ? Heart-healthy fats. Healthy fats called Omega-3 fatty acids are found in foods such as flaxseeds and coldwater fish, like sardines, salmon, and mackerel.  Limit how much you eat of the following: ? Canned or prepackaged foods. ? Food that is high in trans fat, such as fried foods. ? Food that is high in saturated fat, such as fatty meat. ? Sweets, desserts, sugary drinks, and other foods with added sugar. ? Full-fat dairy products.  Do not salt foods before eating.  Try to eat at least 2 vegetarian meals each week.  Eat more home-cooked food and less restaurant, buffet, and fast food.  When eating at a restaurant, ask that your food be prepared with less salt or no salt, if possible. What foods are recommended? The items listed may not be a complete list. Talk with your dietitian about   what dietary choices are best for you. Grains Whole-grain or whole-wheat bread. Whole-grain or whole-wheat pasta. Brown rice. Oatmeal. Quinoa. Bulgur. Whole-grain and low-sodium cereals. Pita bread. Low-fat, low-sodium crackers. Whole-wheat flour tortillas. Vegetables Fresh or frozen vegetables (raw, steamed, roasted, or grilled). Low-sodium or reduced-sodium tomato and vegetable juice. Low-sodium or reduced-sodium tomato sauce and tomato paste. Low-sodium or reduced-sodium canned vegetables. Fruits All fresh, dried, or frozen fruit. Canned fruit in natural juice (without  added sugar). Meat and other protein foods Skinless chicken or turkey. Ground chicken or turkey. Pork with fat trimmed off. Fish and seafood. Egg whites. Dried beans, peas, or lentils. Unsalted nuts, nut butters, and seeds. Unsalted canned beans. Lean cuts of beef with fat trimmed off. Low-sodium, lean deli meat. Dairy Low-fat (1%) or fat-free (skim) milk. Fat-free, low-fat, or reduced-fat cheeses. Nonfat, low-sodium ricotta or cottage cheese. Low-fat or nonfat yogurt. Low-fat, low-sodium cheese. Fats and oils Soft margarine without trans fats. Vegetable oil. Low-fat, reduced-fat, or light mayonnaise and salad dressings (reduced-sodium). Canola, safflower, olive, soybean, and sunflower oils. Avocado. Seasoning and other foods Herbs. Spices. Seasoning mixes without salt. Unsalted popcorn and pretzels. Fat-free sweets. What foods are not recommended? The items listed may not be a complete list. Talk with your dietitian about what dietary choices are best for you. Grains Baked goods made with fat, such as croissants, muffins, or some breads. Dry pasta or rice meal packs. Vegetables Creamed or fried vegetables. Vegetables in a cheese sauce. Regular canned vegetables (not low-sodium or reduced-sodium). Regular canned tomato sauce and paste (not low-sodium or reduced-sodium). Regular tomato and vegetable juice (not low-sodium or reduced-sodium). Pickles. Olives. Fruits Canned fruit in a light or heavy syrup. Fried fruit. Fruit in cream or butter sauce. Meat and other protein foods Fatty cuts of meat. Ribs. Fried meat. Bacon. Sausage. Bologna and other processed lunch meats. Salami. Fatback. Hotdogs. Bratwurst. Salted nuts and seeds. Canned beans with added salt. Canned or smoked fish. Whole eggs or egg yolks. Chicken or turkey with skin. Dairy Whole or 2% milk, cream, and half-and-half. Whole or full-fat cream cheese. Whole-fat or sweetened yogurt. Full-fat cheese. Nondairy creamers. Whipped toppings.  Processed cheese and cheese spreads. Fats and oils Butter. Stick margarine. Lard. Shortening. Ghee. Bacon fat. Tropical oils, such as coconut, palm kernel, or palm oil. Seasoning and other foods Salted popcorn and pretzels. Onion salt, garlic salt, seasoned salt, table salt, and sea salt. Worcestershire sauce. Tartar sauce. Barbecue sauce. Teriyaki sauce. Soy sauce, including reduced-sodium. Steak sauce. Canned and packaged gravies. Fish sauce. Oyster sauce. Cocktail sauce. Horseradish that you find on the shelf. Ketchup. Mustard. Meat flavorings and tenderizers. Bouillon cubes. Hot sauce and Tabasco sauce. Premade or packaged marinades. Premade or packaged taco seasonings. Relishes. Regular salad dressings. Where to find more information:  National Heart, Lung, and Blood Institute: www.nhlbi.nih.gov  American Heart Association: www.heart.org Summary  The DASH eating plan is a healthy eating plan that has been shown to reduce high blood pressure (hypertension). It may also reduce your risk for type 2 diabetes, heart disease, and stroke.  With the DASH eating plan, you should limit salt (sodium) intake to 2,300 mg a day. If you have hypertension, you may need to reduce your sodium intake to 1,500 mg a day.  When on the DASH eating plan, aim to eat more fresh fruits and vegetables, whole grains, lean proteins, low-fat dairy, and heart-healthy fats.  Work with your health care provider or diet and nutrition specialist (dietitian) to adjust your eating plan to your   individual calorie needs. This information is not intended to replace advice given to you by your health care provider. Make sure you discuss any questions you have with your health care provider. Document Revised: 09/11/2017 Document Reviewed: 09/22/2016 Elsevier Patient Education  2020 Else[employer] Inc.  

## 2020-01-19 NOTE — Assessment & Plan Note (Signed)
Neuro / musc exam normal  Robaxin prn  If no better with muscle relaxer check xray

## 2020-01-19 NOTE — Assessment & Plan Note (Signed)
US soft tissue chest

## 2020-01-19 NOTE — Progress Notes (Signed)
Patient ID: Maurice Jimenez, male    DOB: [birth year]  Age: 34 y.o. MRN: [id number]    Subjective:  Subjective  HPI Maurice Jimenez presents for low back -- mid and left sided---- no radiation down leg   Pain is dull but if he moves the wrong way it is a stabbing pain  He also c/o lump in R breast that   Review of Systems  Constitutional: Negative for appetite change, diaphoresis, fatigue and unexpected weight change.  Eyes: Negative for pain, redness and visual disturbance.  Respiratory: Negative for cough, chest tightness, shortness of breath and wheezing.   Cardiovascular: Negative for chest pain, palpitations and leg swelling.  Endocrine: Negative for cold intolerance, heat intolerance, polydipsia, polyphagia and polyuria.  Genitourinary: Negative for difficulty urinating, dysuria and frequency.  Musculoskeletal: Positive for back pain.  Neurological: Negative for dizziness, light-headedness, numbness and headaches.    History Past Medical History:  Diagnosis Date  . GERD (gastroesophageal reflux disease)   . History of chicken pox   . Ingrown toenail     He has a past surgical history that includes No past surgeries and Irrigation and debridement sabaceous cyst.   His family history includes Diabetes in his brother and another family member; Healthy in his daughter, father, mother, sister, and son.He reports that he has never smoked. He has never used smokeless tobacco. He reports current alcohol use. He reports that he does not use drugs.  Current Outpatient Medications on File Prior to Visit  Medication Sig Dispense Refill  . aspirin 325 MG tablet Take 325 mg by mouth daily as needed for mild pain or moderate pain.    Marland Kitchen esomeprazole (NEXIUM) 20 MG capsule Take 20 mg by mouth daily at 12 noon.     No current facility-administered medications on file prior to visit.     Objective:  Objective  Physical Exam Vitals and nursing note reviewed.  Constitutional:      General:  He is sleeping.     Appearance: He is well-developed.  HENT:     Head: Normocephalic and atraumatic.  Eyes:     Pupils: Pupils are equal, round, and reactive to light.  Neck:     Thyroid: No thyromegaly.  Cardiovascular:     Rate and Rhythm: Normal rate and regular rhythm.     Heart sounds: No murmur.  Pulmonary:     Effort: Pulmonary effort is normal. No respiratory distress.     Breath sounds: Normal breath sounds. No wheezing or rales.  Chest:     Chest wall: No tenderness.    Musculoskeletal:        General: No tenderness.     Cervical back: Normal range of motion and neck supple.  Skin:    General: Skin is warm and dry.  Neurological:     Mental Status: He is oriented to person, place, and time.  Psychiatric:        Behavior: Behavior normal.        Thought Content: Thought content normal.        Judgment: Judgment normal.    BP 138/90   Pulse (!) 108   Temp 97.6 F (36.4 C) (Temporal)   Resp 18   Ht 5\' 11"  (1.803 m)   Wt 293 lb 3.2 oz (133 kg)   SpO2 97%   BMI 40.89 kg/m  Wt Readings from Last 3 Encounters:  01/19/20 293 lb 3.2 oz (133 kg)  04/16/16 277 lb 12.8 oz (126 kg)  10/03/15  272 lb 3.2 oz (123.5 kg)     Lab Results  Component Value Date   WBC 6.9 11/22/2014   HGB 15.2 11/22/2014   HCT 44.1 11/22/2014   PLT 246 11/22/2014   GLUCOSE 103 (H) 11/22/2014   NA 141 11/22/2014   K 3.8 11/22/2014   CL 104 11/22/2014   CREATININE 1.08 11/22/2014   BUN 11 11/22/2014   CO2 25 11/22/2014   TSH 0.961 10/30/2014   ekg---  Normal --  No change from previous 2016 2D Echocardiogram without contrast  Result Date: 12/28/2014                *[facility], [place], [place] [address]                            [phone] ------------------------------------------------------------------- Transthoracic Echocardiography Patient:    Maurice Jimenez, Maurice Jimenez MR #:       [id number]  Study Date: 12/28/2014 Gender:     M Age:        28 Height:     182.9 cm Weight:     112.5 kg BSA:        2.43 m^2 Pt. Status: Room:  ORDERING    Lyman Bishop MD  [place] MD  [place]  REFERRING   [facility], [place], Outpatient cc: ------------------------------------------------------------------- LV EF: 60% -   65% ------------------------------------------------------------------- Indications:      Chest Pain (R07.9). ------------------------------------------------------------------- History:   PMH:  Palpitations, GERD Chest pain. ------------------------------------------------------------------- Study Conclusions - Left ventricle: The cavity size was normal. There was top normal   LV wall thickness. Systolic function was normal. The estimated   ejection fraction was in the range of 60% to 65%. Wall motion was   normal; there were no regional wall motion abnormalities. Left   ventricular diastolic function parameters were normal. - Left atrium: LA Volume/BSA= 23.6 ml/m2. The atrium was normal in   size. - Right ventricle: The cavity size was normal. Wall thickness was   normal. The moderator band was prominent. - Tricuspid valve: There was trivial regurgitation. - Pulmonary arteries: PA peak pressure: 19 mm Hg (S). - Inferior vena cava: The vessel was normal in size. The   respirophasic diameter changes were in the normal range (= 50%),   consistent with normal central venous pressure. Impressions: - LVEF 60-65%, top normal LV wall thickness, normal wall motion,   normal diastolic function, normal LA size. ------------------------------------------------------------------- Labs, prior tests, procedures, and surgery: ECG.     Abnormal. Transthoracic echocardiography.  M-mode, complete 2D, spectral Doppler, and color Doppler.  Birthdate:  Patient birthdate: 1985/12/16.  Age:  Patient is 34 yr old.  Sex:  Gender: male. BMI: 33.6 kg/m^2.  Blood pressure:      132/84  Patient status: Outpatient.  Study date:  Study date: 12/28/2014. Study time: 03:17 PM.  Location:  [facility]. ------------------------------------------------------------------- ------------------------------------------------------------------- Left ventricle:  The cavity size was normal. There was top normal LV wall thickness. Systolic function was normal. The estimated ejection fraction was in the range of 60% to 65%. Wall motion was normal; there were no regional wall motion abnormalities. The  transmitral flow pattern was normal. The deceleration time of the early transmitral flow velocity was normal. The pulmonary vein flow pattern was normal. The tissue Doppler parameters were normal. Left ventricular diastolic function parameters were normal. ------------------------------------------------------------------- Aortic valve:   Structurally normal valve. Trileaflet. Cusp separation was normal.  Doppler:  Transvalvular velocity was within the normal range. There was no stenosis. There was no regurgitation. ------------------------------------------------------------------- Aorta:  Aortic root: The aortic root was normal in size. Ascending aorta: The ascending aorta was normal in size. ------------------------------------------------------------------- Mitral valve:   Structurally normal valve.   Leaflet separation was normal.  Doppler:  Transvalvular velocity was within the normal range. There was no evidence for stenosis. There was no regurgitation.    Peak gradient (D): 2 mm Hg. ------------------------------------------------------------------- Left atrium:  LA Volume/BSA= 23.6 ml/m2. The atrium was normal in size. ------------------------------------------------------------------- Atrial septum:  No defect or patent foramen ovale was identified.  ------------------------------------------------------------------- Right ventricle:  The cavity size was normal. Wall thickness was normal. The  moderator band was prominent. Systolic function was normal. ------------------------------------------------------------------- Pulmonic valve:    The valve appears to be grossly normal. Doppler:  There was no significant regurgitation. ------------------------------------------------------------------- Tricuspid valve:   Doppler:  There was trivial regurgitation. ------------------------------------------------------------------- Pulmonary artery:   The main pulmonary artery was normal-sized. ------------------------------------------------------------------- Right atrium:  The atrium was normal in size. ------------------------------------------------------------------- Pericardium:  There was no pericardial effusion. ------------------------------------------------------------------- Systemic veins: Inferior vena cava: The vessel was normal in size. The respirophasic diameter changes were in the normal range (= 50%), consistent with normal central venous pressure. Diameter: 18.8 mm. ------------------------------------------------------------------- Measurements  IVC                                    Value        Reference  ID                                     18.8  mm     ---------   Left ventricle                         Value        Reference  LV ID, ED, PLAX chordal                43.2  mm     43 - 52  LV ID, ES, PLAX chordal                26.2  mm     23 - 38  LV fx shortening, PLAX chordal         39    %      >=29  LV PW thickness, ED                    9.49  mm     ---------  IVS/LV PW ratio, ED                    1.25         <=1.3  LV e&', lateral                         11.7  cm/s   ---------  LV E/e&', lateral  6.16         ---------  LV e&', medial                          8.44  cm/s   ---------  LV E/e&', medial                        8.54         ---------  LV e&', average                         10.07 cm/s   ---------  LV E/e&', average                       7.16          ---------   Ventricular septum                     Value        Reference  IVS thickness, ED                      11.9  mm     ---------   Aorta                                  Value        Reference  Aortic root ID, ED                     33    mm     ---------   Left atrium                            Value        Reference  LA ID, A-P, ES                         46    mm     ---------  LA ID/bsa, A-P                         1.9   cm/m^2 <=2.2  LA volume, S                           55    ml     ---------  LA volume/bsa, S                       22.7  ml/m^2 ---------  LA volume, ES, 1-p A4C                 40    ml     ---------  LA volume/bsa, ES, 1-p A4C             16.5  ml/m^2 ---------  LA volume, ES, 1-p A2C                 74    ml     ---------  LA volume/bsa, ES, 1-p A2C             30.5  ml/m^2 ---------   Mitral valve  Value        Reference  Mitral E-wave peak velocity            72.1  cm/s   ---------  Mitral A-wave peak velocity            47.9  cm/s   ---------  Mitral deceleration time       (H)     313   ms     150 - 230  Mitral peak gradient, D                2     mm Hg  ---------  Mitral E/A ratio, peak                 1.51         ---------   Pulmonary arteries                     Value        Reference  PA pressure, S, DP                     19    mm Hg  <=30   Tricuspid valve                        Value        Reference  Tricuspid regurg peak velocity         216   cm/s   ---------  Tricuspid peak RV-RA gradient          19    mm Hg  ---------   Right ventricle                        Value        Reference  RV s&', lateral, S                      11.1  cm/s   --------- Legend: (L)  and  (H)  mark values outside specified reference range. ------------------------------------------------------------------- Prepared and Electronically Authenticated by Lyman Bishop MD 2016-03-17T16:05:05    Assessment & Plan:  Plan  I have discontinued Maurice Jimenez's ibuprofen,  diazepam, and FLUoxetine. I am also having him start on methocarbamol. Additionally, I am having him maintain his aspirin and esomeprazole.  Meds ordered this encounter  Medications  . methocarbamol (ROBAXIN) 500 MG tablet    Sig: Take 1 tablet (500 mg total) by mouth 4 (four) times daily.    Dispense:  45 tablet    Refill:  0    Problem List Items Addressed This Visit      Unprioritized   Acute left-sided low back pain without sciatica - Primary    Neuro / musc exam normal  Robaxin prn  If no better with muscle relaxer check xray       Relevant Medications   methocarbamol (ROBAXIN) 500 MG tablet   Essential hypertension    Dash diet  Repeat was better  Recheck 3 months Pt will monitor at home as well       Relevant Orders   Lipid panel   CBC with Differential/Platelet   TSH   Comprehensive metabolic panel   Hemoglobin A1c   EKG 12-Lead (Completed)   Mass of soft tissue of chest    US soft tissue chest       Relevant Orders   [place] CHEST  SOFT TISSUE    Other Visit Diagnoses    Family history of diabetes mellitus (DM)       Relevant Orders   Lipid panel   CBC with Differential/Platelet   TSH   Comprehensive metabolic panel   Hemoglobin A1c      Follow-up: Return in about 3 months (around 04/19/2020) for hypertension.  [name], DO

## 2020-01-20 LAB — COMPREHENSIVE METABOLIC PANEL
ALT: 24 U/L (ref 0–53)
AST: 17 U/L (ref 0–37)
Albumin: 4.6 g/dL (ref 3.5–5.2)
Alkaline Phosphatase: 68 U/L (ref 39–117)
BUN: 14 mg/dL (ref 6–23)
CO2: 28 mEq/L (ref 19–32)
Calcium: 9.7 mg/dL (ref 8.4–10.5)
Chloride: 103 mEq/L (ref 96–112)
Creatinine, Ser: 1.04 mg/dL (ref 0.40–1.50)
GFR: 81.1 mL/min (ref >60.00)
Glucose, Bld: 99 mg/dL (ref 70–99)
Potassium: 4.3 mEq/L (ref 3.5–5.1)
Sodium: 141 mEq/L (ref 135–145)
Total Bilirubin: 1 mg/dL (ref 0.2–1.2)
Total Protein: 7 g/dL (ref 6.0–8.3)

## 2020-01-20 LAB — LIPID PANEL
Cholesterol: 213 mg/dL — ABNORMAL HIGH (ref 0–200)
HDL: 32.8 mg/dL — ABNORMAL LOW (ref >39.00)
NonHDL: 180.67
Total CHOL/HDL Ratio: 7
Triglycerides: 303 mg/dL — ABNORMAL HIGH (ref 0.0–149.0)
VLDL: 60.6 mg/dL — ABNORMAL HIGH (ref 0.0–40.0)

## 2020-01-20 LAB — CBC WITH DIFFERENTIAL/PLATELET
Basophils Absolute: 0 10*3/uL (ref 0.0–0.1)
Basophils Relative: 0.5 % (ref 0.0–3.0)
Eosinophils Absolute: 0.1 10*3/uL (ref 0.0–0.7)
Eosinophils Relative: 1.4 % (ref 0.0–5.0)
HCT: 45.3 % (ref 39.0–52.0)
Hemoglobin: 15.4 g/dL (ref 13.0–17.0)
Lymphocytes Relative: 28.5 % (ref 12.0–46.0)
Lymphs Abs: 2.5 10*3/uL (ref 0.7–4.0)
MCHC: 33.9 g/dL (ref 30.0–36.0)
MCV: 90.4 fl (ref 78.0–100.0)
Monocytes Absolute: 0.7 10*3/uL (ref 0.1–1.0)
Monocytes Relative: 7.8 % (ref 3.0–12.0)
Neutro Abs: 5.5 10*3/uL (ref 1.4–7.7)
Neutrophils Relative %: 61.8 % (ref 43.0–77.0)
Platelets: 209 10*3/uL (ref 150.0–400.0)
RBC: 5.01 Mil/uL (ref 4.22–5.81)
RDW: 13.4 % (ref 11.5–15.5)
WBC: 8.9 10*3/uL (ref 4.0–10.5)

## 2020-01-20 LAB — HEMOGLOBIN A1C: Hgb A1c MFr Bld: 5.6 % (ref 4.6–6.5)

## 2020-01-20 LAB — TSH: TSH: 0.97 u[IU]/mL (ref 0.35–4.50)

## 2020-01-20 LAB — LDL CHOLESTEROL, DIRECT: Direct LDL: 138 mg/dL

## 2020-01-22 ENCOUNTER — Other Ambulatory Visit: Payer: Self-pay | Admitting: Family Medicine

## 2020-01-22 DIAGNOSIS — R739 Hyperglycemia, unspecified: Secondary | ICD-10-CM

## 2020-01-22 DIAGNOSIS — E785 Hyperlipidemia, unspecified: Secondary | ICD-10-CM

## 2020-01-23 ENCOUNTER — Ambulatory Visit (HOSPITAL_BASED_OUTPATIENT_CLINIC_OR_DEPARTMENT_OTHER)
Admission: RE | Admit: 2020-01-23 | Discharge: 2020-01-23 | Disposition: A | Discharge status: Home / Self Care | Payer: 59 | Source: Ambulatory Visit | Attending: Family Medicine | Admitting: Family Medicine

## 2020-01-23 DIAGNOSIS — M7989 Other specified soft tissue disorders: Secondary | ICD-10-CM | POA: Insufficient documentation

## 2020-04-19 ENCOUNTER — Ambulatory Visit: Payer: 59 | Admitting: Family Medicine

## 2020-04-24 ENCOUNTER — Other Ambulatory Visit: Payer: Self-pay

## 2020-04-24 ENCOUNTER — Encounter: Payer: Self-pay | Admitting: Family Medicine

## 2020-04-24 ENCOUNTER — Ambulatory Visit: Payer: 59 | Admitting: Family Medicine

## 2020-04-24 VITALS — BP 130/88 | HR 105 | Temp 98.3°F | Resp 18 | Ht 71.0 in | Wt 288.8 lb

## 2020-04-24 DIAGNOSIS — I10 Essential (primary) hypertension: Secondary | ICD-10-CM | POA: Diagnosis not present

## 2020-04-24 DIAGNOSIS — E785 Hyperlipidemia, unspecified: Secondary | ICD-10-CM

## 2020-04-24 DIAGNOSIS — R739 Hyperglycemia, unspecified: Secondary | ICD-10-CM | POA: Diagnosis not present

## 2020-04-24 DIAGNOSIS — M545 Low back pain, unspecified: Secondary | ICD-10-CM

## 2020-04-24 DIAGNOSIS — G8929 Other chronic pain: Secondary | ICD-10-CM

## 2020-04-24 MED ORDER — CYCLOBENZAPRINE HCL 10 MG PO TABS: 10.0000 mg | ORAL_TABLET | Freq: Three times a day (TID) | ORAL | 0 refills | Status: DC | PRN

## 2020-04-24 MED ORDER — LOSARTAN POTASSIUM 50 MG PO TABS: 50.0000 mg | ORAL_TABLET | Freq: Every day | ORAL | 2 refills | Status: DC

## 2020-04-24 NOTE — Progress Notes (Signed)
Patient ID: Maurice Jimenez, male    DOB: 06-Apr-1986  Age: 34 y.o. MRN: [id number]    Subjective:  Subjective  HPI Maurice Jimenez presents for f/u htn and chol    No complaints ----  Except his back still hurts and he needs something to take as needed for pain  Review of Systems  Constitutional: Negative for appetite change, diaphoresis, fatigue and unexpected weight change.  Eyes: Negative for pain, redness and visual disturbance.  Respiratory: Negative for cough, chest tightness, shortness of breath and wheezing.   Cardiovascular: Negative for chest pain, palpitations and leg swelling.  Endocrine: Negative for cold intolerance, heat intolerance, polydipsia, polyphagia and polyuria.  Genitourinary: Negative for difficulty urinating, dysuria and frequency.  Neurological: Negative for dizziness, light-headedness, numbness and headaches.    History Past Medical History:  Diagnosis Date   GERD (gastroesophageal reflux disease)    History of chicken pox    Ingrown toenail     He has a past surgical history that includes No past surgeries and Irrigation and debridement sabaceous cyst.   His family history includes Diabetes in his brother and another family member; Healthy in his daughter, father, mother, sister, and son.He reports that he has never smoked. He has never used smokeless tobacco. He reports current alcohol use. He reports that he does not use drugs.  Current Outpatient Medications on File Prior to Visit  Medication Sig Dispense Refill   aspirin 325 MG tablet Take 325 mg by mouth daily as needed for mild pain or moderate pain.     esomeprazole (NEXIUM) 20 MG capsule Take 20 mg by mouth daily at 12 noon.     No current facility-administered medications on file prior to visit.     Objective:  Objective  Physical Exam Vitals and nursing note reviewed.  Constitutional:      General: He is sleeping.     Appearance: He is well-developed.  HENT:     Head:  Normocephalic and atraumatic.  Eyes:     Pupils: Pupils are equal, round, and reactive to light.  Neck:     Thyroid: No thyromegaly.  Cardiovascular:     Rate and Rhythm: Normal rate and regular rhythm.     Heart sounds: No murmur heard.   Pulmonary:     Effort: Pulmonary effort is normal. No respiratory distress.     Breath sounds: Normal breath sounds. No wheezing or rales.  Chest:     Chest wall: No tenderness.  Musculoskeletal:        General: No tenderness.     Cervical back: Normal range of motion and neck supple.  Skin:    General: Skin is warm and dry.  Neurological:     Mental Status: He is oriented to person, place, and time.  Psychiatric:        Behavior: Behavior normal.        Thought Content: Thought content normal.        Judgment: Judgment normal.    BP 130/88    Pulse (!) 105    Temp 98.3 F (36.8 C) (Temporal)    Resp 18    Ht 5\' 11"  (1.803 m)    Wt 288 lb 12.8 oz (131 kg)    SpO2 97%    BMI 40.28 kg/m  Wt Readings from Last 3 Encounters:  04/24/20 288 lb 12.8 oz (131 kg)  01/19/20 293 lb 3.2 oz (133 kg)  04/16/16 277 lb 12.8 oz (126 kg)     Lab  Results  Component Value Date   WBC 8.9 01/19/2020   HGB 15.4 01/19/2020   HCT 45.3 01/19/2020   PLT 209.0 01/19/2020   GLUCOSE 92 04/24/2020   CHOL 202 (H) 04/24/2020   TRIG 373.0 (H) 04/24/2020   HDL 37.90 (L) 04/24/2020   LDLDIRECT 123.0 04/24/2020   ALT 32 04/24/2020   AST 21 04/24/2020   NA 140 04/24/2020   K 4.4 04/24/2020   CL 101 04/24/2020   CREATININE 1.05 04/24/2020   BUN 13 04/24/2020   CO2 31 04/24/2020   TSH 0.97 01/19/2020   HGBA1C 5.4 04/24/2020    [place] CHEST SOFT TISSUE  Result Date: 01/24/2020 CLINICAL DATA:  Right chest mass EXAM: ULTRASOUND OF chest SOFT TISSUES TECHNIQUE: Ultrasound examination of the head and neck soft tissues was performed in the area of clinical concern. COMPARISON:  None. FINDINGS: Targeted ultrasound of the right chest wall performed in the region of  concern described as the medial chest below the right nipple. In the region of palpable mass is an echogenic mass without well-defined borders measuring 2 x 0.8 x 1.2 cm. No appreciable internal flow. IMPRESSION: 2 cm echogenic mass in the region of palpable concern. Imaging features are nonspecific, but could be due to a fatty mass such as a lipoma. If there is a history of trauma to the region, hematoma could also be considered. Further management will need to be based on clinical grounds Electronically Signed   By: Donavan Foil M.D.   On: 01/24/2020 02:48     Assessment & Plan:  Plan  I have discontinued Maurice Jimenez's methocarbamol. I am also having him start on losartan and cyclobenzaprine. Additionally, I am having him maintain his aspirin and esomeprazole.  Meds ordered this encounter  Medications   losartan (COZAAR) 50 MG tablet    Sig: Take 1 tablet (50 mg total) by mouth daily.    Dispense:  30 tablet    Refill:  2   cyclobenzaprine (FLEXERIL) 10 MG tablet    Sig: Take 1 tablet (10 mg total) by mouth 3 (three) times daily as needed for muscle spasms.    Dispense:  30 tablet    Refill:  0    Problem List Items Addressed This Visit      Unprioritized   Chronic bilateral low back pain without sciatica    Flexeril sent to pharmacy F/u prn       Relevant Medications   cyclobenzaprine (FLEXERIL) 10 MG tablet   Essential hypertension - Primary    Poorly controlled will alter medications, encouraged DASH diet, minimize caffeine and obtain adequate sleep. Report concerning symptoms and follow up as directed and as needed      Relevant Medications   losartan (COZAAR) 50 MG tablet   Hyperglycemia    Check labs       Hyperlipidemia    Encouraged heart healthy diet, increase exercise, avoid trans fats, consider a krill oil cap daily      Relevant Medications   losartan (COZAAR) 50 MG tablet      Follow-up: Return in about 3 weeks (around 05/15/2020) for  hypertension.  [name], DO

## 2020-04-24 NOTE — Patient Instructions (Signed)
DASH Eating Plan DASH stands for "Dietary Approaches to Stop Hypertension." The DASH eating plan is a healthy eating plan that has been shown to reduce high blood pressure (hypertension). It may also reduce your risk for type 2 diabetes, heart disease, and stroke. The DASH eating plan may also help with weight loss. What are tips for following this plan?  General guidelines  Avoid eating more than 2,300 mg (milligrams) of salt (sodium) a day. If you have hypertension, you may need to reduce your sodium intake to 1,500 mg a day.  Limit alcohol intake to no more than 1 drink a day for nonpregnant women and 2 drinks a day for men. One drink equals 12 oz of beer, 5 oz of wine, or 1 oz of hard liquor.  Work with your health care provider to maintain a healthy body weight or to lose weight. Ask what an ideal weight is for you.  Get at least 30 minutes of exercise that causes your heart to beat faster (aerobic exercise) most days of the week. Activities may include walking, swimming, or biking.  Work with your health care provider or diet and nutrition specialist (dietitian) to adjust your eating plan to your individual calorie needs. Reading food labels   Check food labels for the amount of sodium per serving. Choose foods with less than 5 percent of the Daily Value of sodium. Generally, foods with less than 300 mg of sodium per serving fit into this eating plan.  To find whole grains, look for the word "whole" as the first word in the ingredient list. Shopping  Buy products labeled as "low-sodium" or "no salt added."  Buy fresh foods. Avoid canned foods and premade or frozen meals. Cooking  Avoid adding salt when cooking. Use salt-free seasonings or herbs instead of table salt or sea salt. Check with your health care provider or pharmacist before using salt substitutes.  Do not fry foods. Cook foods using healthy methods such as baking, boiling, grilling, and broiling instead.  Cook with  heart-healthy oils, such as olive, canola, soybean, or sunflower oil. Meal planning  Eat a balanced diet that includes: ? 5 or more servings of fruits and vegetables each day. At each meal, try to fill half of your plate with fruits and vegetables. ? Up to 6-8 servings of whole grains each day. ? Less than 6 oz of lean meat, poultry, or fish each day. A 3-oz serving of meat is about the same size as a deck of cards. One egg equals 1 oz. ? 2 servings of low-fat dairy each day. ? A serving of nuts, seeds, or beans 5 times each week. ? Heart-healthy fats. Healthy fats called Omega-3 fatty acids are found in foods such as flaxseeds and coldwater fish, like sardines, salmon, and mackerel.  Limit how much you eat of the following: ? Canned or prepackaged foods. ? Food that is high in trans fat, such as fried foods. ? Food that is high in saturated fat, such as fatty meat. ? Sweets, desserts, sugary drinks, and other foods with added sugar. ? Full-fat dairy products.  Do not salt foods before eating.  Try to eat at least 2 vegetarian meals each week.  Eat more home-cooked food and less restaurant, buffet, and fast food.  When eating at a restaurant, ask that your food be prepared with less salt or no salt, if possible. What foods are recommended? The items listed may not be a complete list. Talk with your dietitian about   what dietary choices are best for you. Grains Whole-grain or whole-wheat bread. Whole-grain or whole-wheat pasta. Brown rice. Oatmeal. Quinoa. Bulgur. Whole-grain and low-sodium cereals. Pita bread. Low-fat, low-sodium crackers. Whole-wheat flour tortillas. Vegetables Fresh or frozen vegetables (raw, steamed, roasted, or grilled). Low-sodium or reduced-sodium tomato and vegetable juice. Low-sodium or reduced-sodium tomato sauce and tomato paste. Low-sodium or reduced-sodium canned vegetables. Fruits All fresh, dried, or frozen fruit. Canned fruit in natural juice (without  added sugar). Meat and other protein foods Skinless chicken or turkey. Ground chicken or turkey. Pork with fat trimmed off. Fish and seafood. Egg whites. Dried beans, peas, or lentils. Unsalted nuts, nut butters, and seeds. Unsalted canned beans. Lean cuts of beef with fat trimmed off. Low-sodium, lean deli meat. Dairy Low-fat (1%) or fat-free (skim) milk. Fat-free, low-fat, or reduced-fat cheeses. Nonfat, low-sodium ricotta or cottage cheese. Low-fat or nonfat yogurt. Low-fat, low-sodium cheese. Fats and oils Soft margarine without trans fats. Vegetable oil. Low-fat, reduced-fat, or light mayonnaise and salad dressings (reduced-sodium). Canola, safflower, olive, soybean, and sunflower oils. Avocado. Seasoning and other foods Herbs. Spices. Seasoning mixes without salt. Unsalted popcorn and pretzels. Fat-free sweets. What foods are not recommended? The items listed may not be a complete list. Talk with your dietitian about what dietary choices are best for you. Grains Baked goods made with fat, such as croissants, muffins, or some breads. Dry pasta or rice meal packs. Vegetables Creamed or fried vegetables. Vegetables in a cheese sauce. Regular canned vegetables (not low-sodium or reduced-sodium). Regular canned tomato sauce and paste (not low-sodium or reduced-sodium). Regular tomato and vegetable juice (not low-sodium or reduced-sodium). Pickles. Olives. Fruits Canned fruit in a light or heavy syrup. Fried fruit. Fruit in cream or butter sauce. Meat and other protein foods Fatty cuts of meat. Ribs. Fried meat. Bacon. Sausage. Bologna and other processed lunch meats. Salami. Fatback. Hotdogs. Bratwurst. Salted nuts and seeds. Canned beans with added salt. Canned or smoked fish. Whole eggs or egg yolks. Chicken or turkey with skin. Dairy Whole or 2% milk, cream, and half-and-half. Whole or full-fat cream cheese. Whole-fat or sweetened yogurt. Full-fat cheese. Nondairy creamers. Whipped toppings.  Processed cheese and cheese spreads. Fats and oils Butter. Stick margarine. Lard. Shortening. Ghee. Bacon fat. Tropical oils, such as coconut, palm kernel, or palm oil. Seasoning and other foods Salted popcorn and pretzels. Onion salt, garlic salt, seasoned salt, table salt, and sea salt. Worcestershire sauce. Tartar sauce. Barbecue sauce. Teriyaki sauce. Soy sauce, including reduced-sodium. Steak sauce. Canned and packaged gravies. Fish sauce. Oyster sauce. Cocktail sauce. Horseradish that you find on the shelf. Ketchup. Mustard. Meat flavorings and tenderizers. Bouillon cubes. Hot sauce and Tabasco sauce. Premade or packaged marinades. Premade or packaged taco seasonings. Relishes. Regular salad dressings. Where to find more information:  National Heart, Lung, and Blood Institute: www.nhlbi.nih.gov  American Heart Association: www.heart.org Summary  The DASH eating plan is a healthy eating plan that has been shown to reduce high blood pressure (hypertension). It may also reduce your risk for type 2 diabetes, heart disease, and stroke.  With the DASH eating plan, you should limit salt (sodium) intake to 2,300 mg a day. If you have hypertension, you may need to reduce your sodium intake to 1,500 mg a day.  When on the DASH eating plan, aim to eat more fresh fruits and vegetables, whole grains, lean proteins, low-fat dairy, and heart-healthy fats.  Work with your health care provider or diet and nutrition specialist (dietitian) to adjust your eating plan to your   individual calorie needs. This information is not intended to replace advice given to you by your health care provider. Make sure you discuss any questions you have with your health care provider. Document Revised: 09/11/2017 Document Reviewed: 09/22/2016 Elsevier Patient Education  2020 Else[employer] Inc.  

## 2020-04-25 LAB — COMPREHENSIVE METABOLIC PANEL
ALT: 32 U/L (ref 0–53)
AST: 21 U/L (ref 0–37)
Albumin: 4.7 g/dL (ref 3.5–5.2)
Alkaline Phosphatase: 70 U/L (ref 39–117)
BUN: 13 mg/dL (ref 6–23)
CO2: 31 mEq/L (ref 19–32)
Calcium: 9.9 mg/dL (ref 8.4–10.5)
Chloride: 101 mEq/L (ref 96–112)
Creatinine, Ser: 1.05 mg/dL (ref 0.40–1.50)
GFR: 80.08 mL/min (ref >60.00)
Glucose, Bld: 92 mg/dL (ref 70–99)
Potassium: 4.4 mEq/L (ref 3.5–5.1)
Sodium: 140 mEq/L (ref 135–145)
Total Bilirubin: 1.2 mg/dL (ref 0.2–1.2)
Total Protein: 7.3 g/dL (ref 6.0–8.3)

## 2020-04-25 LAB — LIPID PANEL
Cholesterol: 202 mg/dL — ABNORMAL HIGH (ref 0–200)
HDL: 37.9 mg/dL — ABNORMAL LOW (ref >39.00)
NonHDL: 164.46
Total CHOL/HDL Ratio: 5
Triglycerides: 373 mg/dL — ABNORMAL HIGH (ref 0.0–149.0)
VLDL: 74.6 mg/dL — ABNORMAL HIGH (ref 0.0–40.0)

## 2020-04-25 LAB — LDL CHOLESTEROL, DIRECT: Direct LDL: 123 mg/dL

## 2020-04-25 LAB — HEMOGLOBIN A1C: Hgb A1c MFr Bld: 5.4 % (ref 4.6–6.5)

## 2020-04-29 DIAGNOSIS — R739 Hyperglycemia, unspecified: Secondary | ICD-10-CM | POA: Insufficient documentation

## 2020-04-29 DIAGNOSIS — E785 Hyperlipidemia, unspecified: Secondary | ICD-10-CM | POA: Insufficient documentation

## 2020-04-29 DIAGNOSIS — G8929 Other chronic pain: Secondary | ICD-10-CM | POA: Insufficient documentation

## 2020-04-29 NOTE — Assessment & Plan Note (Signed)
Flexeril sent to pharmacy F/u prn

## 2020-04-29 NOTE — Assessment & Plan Note (Signed)
Check labs 

## 2020-04-29 NOTE — Assessment & Plan Note (Signed)
Encouraged heart healthy diet, increase exercise, avoid trans fats, consider a krill oil cap daily 

## 2020-04-29 NOTE — Assessment & Plan Note (Signed)
Poorly controlled will alter medications, encouraged DASH diet, minimize caffeine and obtain adequate sleep. Report concerning symptoms and follow up as directed and as needed 

## 2020-05-15 ENCOUNTER — Ambulatory Visit: Payer: 59 | Admitting: Family Medicine

## 2020-05-25 ENCOUNTER — Telehealth (INDEPENDENT_AMBULATORY_CARE_PROVIDER_SITE_OTHER): Payer: 59 | Admitting: Family Medicine

## 2020-05-25 ENCOUNTER — Encounter: Payer: Self-pay | Admitting: Family Medicine

## 2020-05-25 ENCOUNTER — Other Ambulatory Visit: Payer: Self-pay

## 2020-05-25 DIAGNOSIS — I10 Essential (primary) hypertension: Secondary | ICD-10-CM | POA: Diagnosis not present

## 2020-05-25 MED ORDER — LOSARTAN POTASSIUM 50 MG PO TABS: 50.0000 mg | ORAL_TABLET | Freq: Every day | ORAL | 1 refills | Status: DC

## 2020-05-25 NOTE — Progress Notes (Signed)
Virtual Visit via Video Note  I connected with Lister Brizzi on 05/25/20 at  4:00 PM EDT by a video enabled telemedicine application and verified that I am speaking with the correct person using two identifiers.  Location: Patient: home with kids  Provider: office    I discussed the limitations of evaluation and management by telemedicine and the availability of in person appointments. The patient expressed understanding and agreed to proceed.  History of Present Illness: Pt f/u bp .   His wife is sick and got tested for covid -- so pt needed to switch to virtual visit  No complaints  His back is much better    Observations/Objective: Vitals:   05/25/20 1544  Weight: 280 lb 9.6 oz (127.3 kg)  Height: 5\' 11"  (1.803 m)    Assessment and Plan: 1. Essential hypertension Well controlled, no changes to meds. Encouraged heart healthy diet such as the DASH diet and exercise as tolerated.   - losartan (COZAAR) 50 MG tablet; Take 1 tablet (50 mg total) by mouth daily.  Dispense: 90 tablet; Refill: 1   Follow Up Instructions:    I discussed the assessment and treatment plan with the patient. The patient was provided an opportunity to ask questions and all were answered. The patient agreed with the plan and demonstrated an understanding of the instructions.   The patient was advised to call back or seek an in-person evaluation if the symptoms worsen or if the condition fails to improve as anticipated.  I provided 25 minutes of non-face-to-face time during this encounter.   [name], DO

## 2020-06-14 ENCOUNTER — Encounter: Payer: Self-pay | Admitting: Family Medicine

## 2020-06-14 NOTE — Telephone Encounter (Signed)
FYI

## 2020-06-18 ENCOUNTER — Other Ambulatory Visit: Payer: Self-pay | Admitting: Family Medicine

## 2020-06-18 DIAGNOSIS — G8929 Other chronic pain: Secondary | ICD-10-CM

## 2020-06-22 MED ORDER — CYCLOBENZAPRINE HCL 10 MG PO TABS: 10.0000 mg | ORAL_TABLET | Freq: Three times a day (TID) | ORAL | 0 refills | Status: DC | PRN

## 2020-07-16 ENCOUNTER — Other Ambulatory Visit: Payer: Self-pay | Admitting: Family Medicine

## 2020-07-16 DIAGNOSIS — I10 Essential (primary) hypertension: Secondary | ICD-10-CM

## 2020-09-24 ENCOUNTER — Ambulatory Visit: Payer: 59 | Admitting: Family Medicine

## 2020-09-28 ENCOUNTER — Encounter: Payer: Self-pay | Admitting: Family Medicine

## 2020-09-28 ENCOUNTER — Other Ambulatory Visit: Payer: Self-pay

## 2020-09-28 ENCOUNTER — Ambulatory Visit: Payer: 59 | Admitting: Family Medicine

## 2020-09-28 VITALS — BP 122/85 | HR 104 | Temp 98.3°F | Resp 17 | Wt 289.0 lb

## 2020-09-28 DIAGNOSIS — I10 Essential (primary) hypertension: Secondary | ICD-10-CM

## 2020-09-28 DIAGNOSIS — E785 Hyperlipidemia, unspecified: Secondary | ICD-10-CM

## 2020-09-28 NOTE — Assessment & Plan Note (Signed)
Encouraged heart healthy diet, increase exercise, avoid trans fats, consider a krill oil cap daily 

## 2020-09-28 NOTE — Assessment & Plan Note (Signed)
Well controlled, no changes to meds. Encouraged heart healthy diet such as the DASH diet and exercise as tolerated.  °

## 2020-09-28 NOTE — Progress Notes (Signed)
Patient ID: Linwood Gullikson, male    DOB: 12-21-1985  Age: 34 y.o. MRN: [id number]    Subjective:  Subjective  HPI Amjad Fikes presents for f/u bp and cholesterol    No compliants Review of Systems  Constitutional: Negative for appetite change, diaphoresis, fatigue and unexpected weight change.  Eyes: Negative for pain, redness and visual disturbance.  Respiratory: Negative for cough, chest tightness, shortness of breath and wheezing.   Cardiovascular: Negative for chest pain, palpitations and leg swelling.  Endocrine: Negative for cold intolerance, heat intolerance, polydipsia, polyphagia and polyuria.  Genitourinary: Negative for difficulty urinating, dysuria and frequency.  Neurological: Negative for dizziness, light-headedness, numbness and headaches.    History Past Medical History:  Diagnosis Date  . GERD (gastroesophageal reflux disease)   . History of chicken pox   . Ingrown toenail     He has a past surgical history that includes No past surgeries and Irrigation and debridement sabaceous cyst.   His family history includes Diabetes in his brother and another family member; Healthy in his daughter, father, mother, sister, and son.He reports that he has never smoked. He has never used smokeless tobacco. He reports current alcohol use. He reports that he does not use drugs.  Current Outpatient Medications on File Prior to Visit  Medication Sig Dispense Refill  . aspirin 325 MG tablet Take 325 mg by mouth daily as needed for mild pain or moderate pain.    . cyclobenzaprine (FLEXERIL) 10 MG tablet Take 1 tablet (10 mg total) by mouth 3 (three) times daily as needed for muscle spasms. 30 tablet 0  . losartan (COZAAR) 50 MG tablet Take 1 tablet (50 mg total) by mouth daily. 90 tablet 1   No current facility-administered medications on file prior to visit.     Objective:  Objective  Physical Exam Vitals and nursing note reviewed.  Constitutional:      General: He is  sleeping. Vital signs are normal.     Appearance: He is well-developed and well-nourished.  HENT:     Head: Normocephalic and atraumatic.     Mouth/Throat:     Mouth: Oropharynx is clear and moist.  Eyes:     Extraocular Movements: EOM normal.     Pupils: Pupils are equal, round, and reactive to light.  Neck:     Thyroid: No thyromegaly.  Cardiovascular:     Rate and Rhythm: Normal rate and regular rhythm.     Heart sounds: No murmur heard.   Pulmonary:     Effort: Pulmonary effort is normal. No respiratory distress.     Breath sounds: Normal breath sounds. No wheezing or rales.  Chest:     Chest wall: No tenderness.  Musculoskeletal:        General: No tenderness or edema.     Cervical back: Normal range of motion and neck supple.  Skin:    General: Skin is warm and dry.  Neurological:     Mental Status: He is oriented to person, place, and time.  Psychiatric:        Mood and Affect: Mood and affect normal.        Behavior: Behavior normal.        Thought Content: Thought content normal.        Judgment: Judgment normal.    BP 122/85 (BP Location: Left Arm, Patient Position: Sitting, Cuff Size: Large)   Pulse (!) 104   Temp 98.3 F (36.8 C) (Oral)   Resp 17   Wt  289 lb (131.1 kg)   SpO2 98%   BMI 40.31 kg/m  Wt Readings from Last 3 Encounters:  09/28/20 289 lb (131.1 kg)  05/25/20 280 lb 9.6 oz (127.3 kg)  04/24/20 288 lb 12.8 oz (131 kg)     Lab Results  Component Value Date   WBC 8.9 01/19/2020   HGB 15.4 01/19/2020   HCT 45.3 01/19/2020   PLT 209.0 01/19/2020   GLUCOSE 92 04/24/2020   CHOL 202 (H) 04/24/2020   TRIG 373.0 (H) 04/24/2020   HDL 37.90 (L) 04/24/2020   LDLDIRECT 123.0 04/24/2020   ALT 32 04/24/2020   AST 21 04/24/2020   NA 140 04/24/2020   K 4.4 04/24/2020   CL 101 04/24/2020   CREATININE 1.05 04/24/2020   BUN 13 04/24/2020   CO2 31 04/24/2020   TSH 0.97 01/19/2020   HGBA1C 5.4 04/24/2020    [place] CHEST SOFT TISSUE  Result Date:  01/24/2020 CLINICAL DATA:  Right chest mass EXAM: ULTRASOUND OF chest SOFT TISSUES TECHNIQUE: Ultrasound examination of the head and neck soft tissues was performed in the area of clinical concern. COMPARISON:  None. FINDINGS: Targeted ultrasound of the right chest wall performed in the region of concern described as the medial chest below the right nipple. In the region of palpable mass is an echogenic mass without well-defined borders measuring 2 x 0.8 x 1.2 cm. No appreciable internal flow. IMPRESSION: 2 cm echogenic mass in the region of palpable concern. Imaging features are nonspecific, but could be due to a fatty mass such as a lipoma. If there is a history of trauma to the region, hematoma could also be considered. Further management will need to be based on clinical grounds Electronically Signed   By: Donavan Foil M.D.   On: 01/24/2020 02:48     Assessment & Plan:  Plan  I have discontinued Pinchos Tschetter's esomeprazole. I am also having him maintain his aspirin, cyclobenzaprine, and losartan.  No orders of the defined types were placed in this encounter.   Problem List Items Addressed This Visit      Unprioritized   Hyperlipidemia    Encouraged heart healthy diet, increase exercise, avoid trans fats, consider a krill oil cap daily      Relevant Orders   Lipid panel   Comprehensive metabolic panel   Primary hypertension - Primary    Well controlled, no changes to meds. Encouraged heart healthy diet such as the DASH diet and exercise as tolerated.       Relevant Orders   Lipid panel   Comprehensive metabolic panel      Follow-up: Return in about 6 months (around 03/29/2021), or if symptoms worsen or fail to improve.  [name], DO

## 2020-09-28 NOTE — Patient Instructions (Signed)
DASH Eating Plan DASH stands for "Dietary Approaches to Stop Hypertension." The DASH eating plan is a healthy eating plan that has been shown to reduce high blood pressure (hypertension). It may also reduce your risk for type 2 diabetes, heart disease, and stroke. The DASH eating plan may also help with weight loss. What are tips for following this plan?  General guidelines  Avoid eating more than 2,300 mg (milligrams) of salt (sodium) a day. If you have hypertension, you may need to reduce your sodium intake to 1,500 mg a day.  Limit alcohol intake to no more than 1 drink a day for nonpregnant women and 2 drinks a day for men. One drink equals 12 oz of beer, 5 oz of wine, or 1 oz of hard liquor.  Work with your health care provider to maintain a healthy body weight or to lose weight. Ask what an ideal weight is for you.  Get at least 30 minutes of exercise that causes your heart to beat faster (aerobic exercise) most days of the week. Activities may include walking, swimming, or biking.  Work with your health care provider or diet and nutrition specialist (dietitian) to adjust your eating plan to your individual calorie needs. Reading food labels   Check food labels for the amount of sodium per serving. Choose foods with less than 5 percent of the Daily Value of sodium. Generally, foods with less than 300 mg of sodium per serving fit into this eating plan.  To find whole grains, look for the word "whole" as the first word in the ingredient list. Shopping  Buy products labeled as "low-sodium" or "no salt added."  Buy fresh foods. Avoid canned foods and premade or frozen meals. Cooking  Avoid adding salt when cooking. Use salt-free seasonings or herbs instead of table salt or sea salt. Check with your health care provider or pharmacist before using salt substitutes.  Do not fry foods. Cook foods using healthy methods such as baking, boiling, grilling, and broiling instead.  Cook with  heart-healthy oils, such as olive, canola, soybean, or sunflower oil. Meal planning  Eat a balanced diet that includes: ? 5 or more servings of fruits and vegetables each day. At each meal, try to fill half of your plate with fruits and vegetables. ? Up to 6-8 servings of whole grains each day. ? Less than 6 oz of lean meat, poultry, or fish each day. A 3-oz serving of meat is about the same size as a deck of cards. One egg equals 1 oz. ? 2 servings of low-fat dairy each day. ? A serving of nuts, seeds, or beans 5 times each week. ? Heart-healthy fats. Healthy fats called Omega-3 fatty acids are found in foods such as flaxseeds and coldwater fish, like sardines, salmon, and mackerel.  Limit how much you eat of the following: ? Canned or prepackaged foods. ? Food that is high in trans fat, such as fried foods. ? Food that is high in saturated fat, such as fatty meat. ? Sweets, desserts, sugary drinks, and other foods with added sugar. ? Full-fat dairy products.  Do not salt foods before eating.  Try to eat at least 2 vegetarian meals each week.  Eat more home-cooked food and less restaurant, buffet, and fast food.  When eating at a restaurant, ask that your food be prepared with less salt or no salt, if possible. What foods are recommended? The items listed may not be a complete list. Talk with your dietitian about   what dietary choices are best for you. Grains Whole-grain or whole-wheat bread. Whole-grain or whole-wheat pasta. Brown rice. Oatmeal. Quinoa. Bulgur. Whole-grain and low-sodium cereals. Pita bread. Low-fat, low-sodium crackers. Whole-wheat flour tortillas. Vegetables Fresh or frozen vegetables (raw, steamed, roasted, or grilled). Low-sodium or reduced-sodium tomato and vegetable juice. Low-sodium or reduced-sodium tomato sauce and tomato paste. Low-sodium or reduced-sodium canned vegetables. Fruits All fresh, dried, or frozen fruit. Canned fruit in natural juice (without  added sugar). Meat and other protein foods Skinless chicken or turkey. Ground chicken or turkey. Pork with fat trimmed off. Fish and seafood. Egg whites. Dried beans, peas, or lentils. Unsalted nuts, nut butters, and seeds. Unsalted canned beans. Lean cuts of beef with fat trimmed off. Low-sodium, lean deli meat. Dairy Low-fat (1%) or fat-free (skim) milk. Fat-free, low-fat, or reduced-fat cheeses. Nonfat, low-sodium ricotta or cottage cheese. Low-fat or nonfat yogurt. Low-fat, low-sodium cheese. Fats and oils Soft margarine without trans fats. Vegetable oil. Low-fat, reduced-fat, or light mayonnaise and salad dressings (reduced-sodium). Canola, safflower, olive, soybean, and sunflower oils. Avocado. Seasoning and other foods Herbs. Spices. Seasoning mixes without salt. Unsalted popcorn and pretzels. Fat-free sweets. What foods are not recommended? The items listed may not be a complete list. Talk with your dietitian about what dietary choices are best for you. Grains Baked goods made with fat, such as croissants, muffins, or some breads. Dry pasta or rice meal packs. Vegetables Creamed or fried vegetables. Vegetables in a cheese sauce. Regular canned vegetables (not low-sodium or reduced-sodium). Regular canned tomato sauce and paste (not low-sodium or reduced-sodium). Regular tomato and vegetable juice (not low-sodium or reduced-sodium). Pickles. Olives. Fruits Canned fruit in a light or heavy syrup. Fried fruit. Fruit in cream or butter sauce. Meat and other protein foods Fatty cuts of meat. Ribs. Fried meat. Bacon. Sausage. Bologna and other processed lunch meats. Salami. Fatback. Hotdogs. Bratwurst. Salted nuts and seeds. Canned beans with added salt. Canned or smoked fish. Whole eggs or egg yolks. Chicken or turkey with skin. Dairy Whole or 2% milk, cream, and half-and-half. Whole or full-fat cream cheese. Whole-fat or sweetened yogurt. Full-fat cheese. Nondairy creamers. Whipped toppings.  Processed cheese and cheese spreads. Fats and oils Butter. Stick margarine. Lard. Shortening. Ghee. Bacon fat. Tropical oils, such as coconut, palm kernel, or palm oil. Seasoning and other foods Salted popcorn and pretzels. Onion salt, garlic salt, seasoned salt, table salt, and sea salt. Worcestershire sauce. Tartar sauce. Barbecue sauce. Teriyaki sauce. Soy sauce, including reduced-sodium. Steak sauce. Canned and packaged gravies. Fish sauce. Oyster sauce. Cocktail sauce. Horseradish that you find on the shelf. Ketchup. Mustard. Meat flavorings and tenderizers. Bouillon cubes. Hot sauce and Tabasco sauce. Premade or packaged marinades. Premade or packaged taco seasonings. Relishes. Regular salad dressings. Where to find more information:  National Heart, Lung, and Blood Institute: www.nhlbi.nih.gov  American Heart Association: www.heart.org Summary  The DASH eating plan is a healthy eating plan that has been shown to reduce high blood pressure (hypertension). It may also reduce your risk for type 2 diabetes, heart disease, and stroke.  With the DASH eating plan, you should limit salt (sodium) intake to 2,300 mg a day. If you have hypertension, you may need to reduce your sodium intake to 1,500 mg a day.  When on the DASH eating plan, aim to eat more fresh fruits and vegetables, whole grains, lean proteins, low-fat dairy, and heart-healthy fats.  Work with your health care provider or diet and nutrition specialist (dietitian) to adjust your eating plan to your   individual calorie needs. This information is not intended to replace advice given to you by your health care provider. Make sure you discuss any questions you have with your health care provider. Document Revised: 09/11/2017 Document Reviewed: 09/22/2016 Elsevier Patient Education  2020 Else[employer] Inc.  

## 2020-09-29 LAB — COMPREHENSIVE METABOLIC PANEL
AG Ratio: 1.7 (calc) (ref 1.0–2.5)
ALT: 20 U/L (ref 9–46)
AST: 15 U/L (ref 10–40)
Albumin: 4.4 g/dL (ref 3.6–5.1)
Alkaline phosphatase (APISO): 61 U/L (ref 36–130)
BUN: 15 mg/dL (ref 7–25)
CO2: 28 mmol/L (ref 20–32)
Calcium: 9.5 mg/dL (ref 8.6–10.3)
Chloride: 104 mmol/L (ref 98–110)
Creat: 1.18 mg/dL (ref 0.60–1.35)
Globulin: 2.6 g/dL (calc) (ref 1.9–3.7)
Glucose, Bld: 84 mg/dL (ref 65–99)
Potassium: 4.5 mmol/L (ref 3.5–5.3)
Sodium: 142 mmol/L (ref 135–146)
Total Bilirubin: 1.2 mg/dL (ref 0.2–1.2)
Total Protein: 7 g/dL (ref 6.1–8.1)

## 2020-09-29 LAB — LIPID PANEL
Cholesterol: 188 mg/dL (ref <200)
HDL: 33 mg/dL — ABNORMAL LOW (ref >=40)
LDL Cholesterol (Calc): 107 mg/dL (calc) — ABNORMAL HIGH
Non-HDL Cholesterol (Calc): 155 mg/dL (calc) — ABNORMAL HIGH (ref <130)
Total CHOL/HDL Ratio: 5.7 (calc) — ABNORMAL HIGH (ref <5.0)
Triglycerides: 360 mg/dL — ABNORMAL HIGH (ref <150)

## 2020-10-03 ENCOUNTER — Other Ambulatory Visit: Payer: Self-pay

## 2020-10-03 ENCOUNTER — Other Ambulatory Visit: Payer: Self-pay | Admitting: Family Medicine

## 2020-10-03 DIAGNOSIS — E781 Pure hyperglyceridemia: Secondary | ICD-10-CM

## 2020-10-03 MED ORDER — FENOFIBRATE 160 MG PO TABS: 160.0000 mg | ORAL_TABLET | Freq: Every day | ORAL | 2 refills | Status: DC

## 2021-01-02 ENCOUNTER — Other Ambulatory Visit: Payer: Self-pay | Admitting: Family Medicine

## 2021-01-12 ENCOUNTER — Other Ambulatory Visit: Payer: Self-pay | Admitting: Family Medicine

## 2021-01-12 DIAGNOSIS — I10 Essential (primary) hypertension: Secondary | ICD-10-CM

## 2021-04-02 ENCOUNTER — Other Ambulatory Visit: Payer: Self-pay

## 2021-04-02 ENCOUNTER — Ambulatory Visit: Payer: 59 | Admitting: Family Medicine

## 2021-04-02 ENCOUNTER — Encounter: Payer: Self-pay | Admitting: Family Medicine

## 2021-04-02 VITALS — BP 140/90 | HR 105 | Temp 98.4°F | Resp 18 | Ht 71.0 in | Wt 295.6 lb

## 2021-04-02 DIAGNOSIS — E785 Hyperlipidemia, unspecified: Secondary | ICD-10-CM | POA: Diagnosis not present

## 2021-04-02 DIAGNOSIS — I10 Essential (primary) hypertension: Secondary | ICD-10-CM

## 2021-04-02 MED ORDER — LOSARTAN POTASSIUM 100 MG PO TABS: 100.0000 mg | ORAL_TABLET | Freq: Every day | ORAL | 1 refills | Status: DC

## 2021-04-02 NOTE — Patient Instructions (Signed)
https://www.nhlbi.nih.gov/files/docs/public/heart/dash_brief.pdf">  DASH Eating Plan DASH stands for Dietary Approaches to Stop Hypertension. The DASH eating plan is a healthy eating plan that has been shown to: Reduce high blood pressure (hypertension). Reduce your risk for type 2 diabetes, heart disease, and stroke. Help with weight loss. What are tips for following this plan? Reading food labels Check food labels for the amount of salt (sodium) per serving. Choose foods with less than 5 percent of the Daily Value of sodium. Generally, foods with less than 300 milligrams (mg) of sodium per serving fit into this eating plan. To find whole grains, look for the word "whole" as the first word in the ingredient list. Shopping Buy products labeled as "low-sodium" or "no salt added." Buy fresh foods. Avoid canned foods and pre-made or frozen meals. Cooking Avoid adding salt when cooking. Use salt-free seasonings or herbs instead of table salt or sea salt. Check with your health care provider or pharmacist before using salt substitutes. Do not fry foods. Cook foods using healthy methods such as baking, boiling, grilling, roasting, and broiling instead. Cook with heart-healthy oils, such as olive, canola, avocado, soybean, or sunflower oil. Meal planning  Eat a balanced diet that includes: 4 or more servings of fruits and 4 or more servings of vegetables each day. Try to fill one-half of your plate with fruits and vegetables. 6-8 servings of whole grains each day. Less than 6 oz (170 g) of lean meat, poultry, or fish each day. A 3-oz (85-g) serving of meat is about the same size as a deck of cards. One egg equals 1 oz (28 g). 2-3 servings of low-fat dairy each day. One serving is 1 cup (237 mL). 1 serving of nuts, seeds, or beans 5 times each week. 2-3 servings of heart-healthy fats. Healthy fats called omega-3 fatty acids are found in foods such as walnuts, flaxseeds, fortified milks, and eggs.  These fats are also found in cold-water fish, such as sardines, salmon, and mackerel. Limit how much you eat of: Canned or prepackaged foods. Food that is high in trans fat, such as some fried foods. Food that is high in saturated fat, such as fatty meat. Desserts and other sweets, sugary drinks, and other foods with added sugar. Full-fat dairy products. Do not salt foods before eating. Do not eat more than 4 egg yolks a week. Try to eat at least 2 vegetarian meals a week. Eat more home-cooked food and less restaurant, buffet, and fast food.  Lifestyle When eating at a restaurant, ask that your food be prepared with less salt or no salt, if possible. If you drink alcohol: Limit how much you use to: 0-1 drink a day for women who are not pregnant. 0-2 drinks a day for men. Be aware of how much alcohol is in your drink. In the [place] one drink equals one 12 oz bottle of beer (355 mL), one 5 oz glass of wine (148 mL), or one 1 oz glass of hard liquor (44 mL). General information Avoid eating more than 2,300 mg of salt a day. If you have hypertension, you may need to reduce your sodium intake to 1,500 mg a day. Work with your health care provider to maintain a healthy body weight or to lose weight. Ask what an ideal weight is for you. Get at least 30 minutes of exercise that causes your heart to beat faster (aerobic exercise) most days of the week. Activities may include walking, swimming, or biking. Work with your health care provider   or dietitian to adjust your eating plan to your individual calorie needs. What foods should I eat? Fruits All fresh, dried, or frozen fruit. Canned fruit in natural juice (without addedsugar). Vegetables Fresh or frozen vegetables (raw, steamed, roasted, or grilled). Low-sodium or reduced-sodium tomato and vegetable juice. Low-sodium or reduced-sodium tomatosauce and tomato paste. Low-sodium or reduced-sodium canned vegetables. Grains Whole-grain or  whole-wheat bread. Whole-grain or whole-wheat pasta. Brown rice. Oatmeal. Quinoa. Bulgur. Whole-grain and low-sodium cereals. Pita bread.Low-fat, low-sodium crackers. Whole-wheat flour tortillas. Meats and other proteins Skinless chicken or turkey. Ground chicken or turkey. Pork with fat trimmed off. Fish and seafood. Egg whites. Dried beans, peas, or lentils. Unsalted nuts, nut butters, and seeds. Unsalted canned beans. Lean cuts of beef with fat trimmed off. Low-sodium, lean precooked or cured meat, such as sausages or meatloaves. Dairy Low-fat (1%) or fat-free (skim) milk. Reduced-fat, low-fat, or fat-free cheeses. Nonfat, low-sodium ricotta or cottage cheese. Low-fat or nonfatyogurt. Low-fat, low-sodium cheese. Fats and oils Soft margarine without trans fats. Vegetable oil. Reduced-fat, low-fat, or light mayonnaise and salad dressings (reduced-sodium). Canola, safflower, olive, avocado, soybean, andsunflower oils. Avocado. Seasonings and condiments Herbs. Spices. Seasoning mixes without salt. Other foods Unsalted popcorn and pretzels. Fat-free sweets. The items listed above may not be a complete list of foods and beverages you can eat. Contact a dietitian for more information. What foods should I avoid? Fruits Canned fruit in a light or heavy syrup. Fried fruit. Fruit in cream or buttersauce. Vegetables Creamed or fried vegetables. Vegetables in a cheese sauce. Regular canned vegetables (not low-sodium or reduced-sodium). Regular canned tomato sauce and paste (not low-sodium or reduced-sodium). Regular tomato and vegetable juice(not low-sodium or reduced-sodium). Pickles. Olives. Grains Baked goods made with fat, such as croissants, muffins, or some breads. Drypasta or rice meal packs. Meats and other proteins Fatty cuts of meat. Ribs. Fried meat. Bacon. Bologna, salami, and other precooked or cured meats, such as sausages or meat loaves. Fat from the back of a pig (fatback). Bratwurst.  Salted nuts and seeds. Canned beans with added salt. Canned orsmoked fish. Whole eggs or egg yolks. Chicken or turkey with skin. Dairy Whole or 2% milk, cream, and half-and-half. Whole or full-fat cream cheese. Whole-fat or sweetened yogurt. Full-fat cheese. Nondairy creamers. Whippedtoppings. Processed cheese and cheese spreads. Fats and oils Butter. Stick margarine. Lard. Shortening. Ghee. Bacon fat. Tropical oils, suchas coconut, palm kernel, or palm oil. Seasonings and condiments Onion salt, garlic salt, seasoned salt, table salt, and sea salt. Worcestershire sauce. Tartar sauce. Barbecue sauce. Teriyaki sauce. Soy sauce, including reduced-sodium. Steak sauce. Canned and packaged gravies. Fish sauce. Oyster sauce. Cocktail sauce. Store-bought horseradish. Ketchup. Mustard. Meat flavorings and tenderizers. Bouillon cubes. Hot sauces. Pre-made or packaged marinades. Pre-made or packaged taco seasonings. Relishes. Regular saladdressings. Other foods Salted popcorn and pretzels. The items listed above may not be a complete list of foods and beverages you should avoid. Contact a dietitian for more information. Where to find more information National Heart, Lung, and Blood Institute: www.nhlbi.nih.gov American Heart Association: www.heart.org Academy of Nutrition and Dietetics: www.eatright.org National Kidney Foundation: www.kidney.org Summary The DASH eating plan is a healthy eating plan that has been shown to reduce high blood pressure (hypertension). It may also reduce your risk for type 2 diabetes, heart disease, and stroke. When on the DASH eating plan, aim to eat more fresh fruits and vegetables, whole grains, lean proteins, low-fat dairy, and heart-healthy fats. With the DASH eating plan, you should limit salt (sodium) intake to 2,300   mg a day. If you have hypertension, you may need to reduce your sodium intake to 1,500 mg a day. Work with your health care provider or dietitian to adjust  your eating plan to your individual calorie needs. This information is not intended to replace advice given to you by your health care provider. Make sure you discuss any questions you have with your healthcare provider. Document Revised: 09/02/2019 Document Reviewed: 09/02/2019 Elsevier Patient Education  2022 [employer]  

## 2021-04-02 NOTE — Progress Notes (Addendum)
Subjective:   By signing my name below, I, Maurice Jimenez, attest that this documentation has been prepared under the direction and in the presence of Dr. Roma Schanz, DO. 04/02/2021      Patient ID: Maurice Jimenez, male    DOB: 01/12/1986, 35 y.o.   MRN: [id number]  Chief Complaint  Patient presents with   Hypertension   Hyperlipidemia   Follow-up    Hypertension Pertinent negatives include no chest pain, headaches, malaise/fatigue, palpitations or shortness of breath.  Hyperlipidemia Pertinent negatives include no chest pain, myalgias or shortness of breath.  Patient is in today for a office visit.  His blood pressure and pulse is elevated during this visit. He eats greasy foods and sweets at least once a week. He does not know if he has a family history of hypertension. His pulse usually measures around 68-80 bpm when measured at home. He recently had a cortisone shot on his arm that is still causing him pain at this time. He continues taking 50 mg Losartan daily PO to manage his blood pressure.  BP Readings from Last 3 Encounters:  04/02/21 140/90  09/28/20 122/85  05/25/20 138/85    Pulse Readings from Last 3 Encounters:  04/02/21 (!) 105  09/28/20 (!) 104  05/25/20 77     Past Medical History:  Diagnosis Date   GERD (gastroesophageal reflux disease)    History of chicken pox    Ingrown toenail     Past Surgical History:  Procedure Laterality Date   IRRIGATION AND DEBRIDEMENT SEBACEOUS CYST     l upper arm   NO PAST SURGERIES      Family History  Problem Relation Age of Onset   Diabetes Other        Paternal side   Healthy Mother        Living   Healthy Father        Living   Diabetes Brother        x1   Healthy Sister        x2   Healthy Son        x1   Healthy Daughter        x1    Social History   Socioeconomic History   Marital status: Married    Spouse name: brooke    Number of children: Not on file   Years of education: Not on  file   Highest education level: Not on file  Occupational History   Occupation: help desk    Employer: [employer]  Tobacco Use   Smoking status: Never   Smokeless tobacco: Never  Vaping Use   Vaping Use: Never used  Substance and Sexual Activity   Alcohol use: Yes    Alcohol/week: 0.0 standard drinks    Comment: rare   Drug use: No   Sexual activity: Yes    Partners: Female  Other Topics Concern   Not on file  Social History Narrative   Not on file   Social Determinants of Health   Financial Resource Strain: Not on file  Food Insecurity: Not on file  Transportation Needs: Not on file  Physical Activity: Not on file  Stress: Not on file  Social Connections: Not on file  Intimate Partner Violence: Not on file    Outpatient Medications Prior to Visit  Medication Sig Dispense Refill   aspirin 325 MG tablet Take 325 mg by mouth daily as needed for mild pain or moderate pain.     cyclobenzaprine (  FLEXERIL) 10 MG tablet Take 1 tablet (10 mg total) by mouth 3 (three) times daily as needed for muscle spasms. 30 tablet 0   fenofibrate 160 MG tablet TAKE 1 TABLET BY MOUTH EVERY DAY 90 tablet 0   losartan (COZAAR) 50 MG tablet Take 1 tablet (50 mg total) by mouth daily. 90 tablet 1   No facility-administered medications prior to visit.    No Known Allergies  Review of Systems  Constitutional:  Negative for chills, fever and malaise/fatigue.  HENT:  Negative for congestion and hearing loss.   Eyes:  Negative for discharge.  Respiratory:  Negative for cough, sputum production and shortness of breath.   Cardiovascular:  Negative for chest pain, palpitations and leg swelling.  Gastrointestinal:  Negative for abdominal pain, blood in stool, constipation, diarrhea, heartburn, nausea and vomiting.  Genitourinary:  Negative for dysuria, frequency, hematuria and urgency.  Musculoskeletal:  Negative for back pain, falls and myalgias.  Skin:  Negative for rash.  Neurological:  Negative  for dizziness, sensory change, loss of consciousness, weakness and headaches.  Endo/Heme/Allergies:  Negative for environmental allergies. Does not bruise/bleed easily.  Psychiatric/Behavioral:  Negative for depression and suicidal ideas. The patient is not nervous/anxious and does not have insomnia.       Objective:    Physical Exam Vitals and nursing note reviewed.  Constitutional:      General: He is not in acute distress.    Appearance: Normal appearance. He is not ill-appearing.  HENT:     Head: Normocephalic and atraumatic.     Right Ear: External ear normal.     Left Ear: External ear normal.  Eyes:     Extraocular Movements: Extraocular movements intact.     Pupils: Pupils are equal, round, and reactive to light.  Cardiovascular:     Rate and Rhythm: Normal rate and regular rhythm.     Pulses: Normal pulses.     Heart sounds: Normal heart sounds. No murmur heard.   No gallop.     Comments: His heart rate decreased to around 80 during the PE. Pulmonary:     Effort: Pulmonary effort is normal. No respiratory distress.     Breath sounds: Normal breath sounds. No wheezing, rhonchi or rales.  Skin:    General: Skin is warm and dry.  Neurological:     Mental Status: He is alert and oriented to person, place, and time.  Psychiatric:        Behavior: Behavior normal.    BP 140/90 (BP Location: Right Arm, Patient Position: Sitting, Cuff Size: Large)   Pulse (!) 105   Temp 98.4 F (36.9 C) (Oral)   Resp 18   Ht 5\' 11"  (1.803 m)   Wt 295 lb 9.6 oz (134.1 kg)   SpO2 98%   BMI 41.23 kg/m  Wt Readings from Last 3 Encounters:  04/02/21 295 lb 9.6 oz (134.1 kg)  09/28/20 289 lb (131.1 kg)  05/25/20 280 lb 9.6 oz (127.3 kg)    Diabetic Foot Exam - Simple   No data filed    Lab Results  Component Value Date   WBC 8.9 01/19/2020   HGB 15.4 01/19/2020   HCT 45.3 01/19/2020   PLT 209.0 01/19/2020   GLUCOSE 102 (H) 04/02/2021   CHOL 180 04/02/2021   TRIG 111.0  04/02/2021   HDL 39.70 04/02/2021   LDLDIRECT 123.0 04/24/2020   LDLCALC 118 (H) 04/02/2021   ALT 50 04/02/2021   AST 31 04/02/2021   NA  140 04/02/2021   K 4.3 04/02/2021   CL 102 04/02/2021   CREATININE 1.40 04/02/2021   BUN 19 04/02/2021   CO2 29 04/02/2021   TSH 0.97 01/19/2020   HGBA1C 5.4 04/24/2020    Lab Results  Component Value Date   TSH 0.97 01/19/2020   Lab Results  Component Value Date   WBC 8.9 01/19/2020   HGB 15.4 01/19/2020   HCT 45.3 01/19/2020   MCV 90.4 01/19/2020   PLT 209.0 01/19/2020   Lab Results  Component Value Date   NA 140 04/02/2021   K 4.3 04/02/2021   CO2 29 04/02/2021   GLUCOSE 102 (H) 04/02/2021   BUN 19 04/02/2021   CREATININE 1.40 04/02/2021   BILITOT 0.7 04/02/2021   ALKPHOS 43 04/02/2021   AST 31 04/02/2021   ALT 50 04/02/2021   PROT 7.4 04/02/2021   ALBUMIN 4.8 04/02/2021   CALCIUM 9.8 04/02/2021   ANIONGAP 12 11/22/2014   GFR 64.63 04/02/2021   Lab Results  Component Value Date   CHOL 180 04/02/2021   Lab Results  Component Value Date   HDL 39.70 04/02/2021   Lab Results  Component Value Date   LDLCALC 118 (H) 04/02/2021   Lab Results  Component Value Date   TRIG 111.0 04/02/2021   Lab Results  Component Value Date   CHOLHDL 5 04/02/2021   Lab Results  Component Value Date   HGBA1C 5.4 04/24/2020       Assessment & Plan:   Problem List Items Addressed This Visit       Unprioritized   Hyperlipidemia    Encourage heart healthy diet such as MIND or DASH diet, increase exercise, avoid trans fats, simple carbohydrates and processed foods, consider a krill or fish or flaxseed oil cap daily.        Relevant Medications   losartan (COZAAR) 100 MG tablet   Other Relevant Orders   Lipid panel (Completed)   Comprehensive metabolic panel (Completed)   Primary hypertension - Primary    Poorly controlled will alter medications, encouraged DASH diet, minimize caffeine and obtain adequate sleep. Report  concerning symptoms and follow up as directed and as needed       Relevant Medications   losartan (COZAAR) 100 MG tablet   Other Relevant Orders   Lipid panel (Completed)   Comprehensive metabolic panel (Completed)     Meds ordered this encounter  Medications   losartan (COZAAR) 100 MG tablet    Sig: Take 1 tablet (100 mg total) by mouth daily.    Dispense:  90 tablet    Refill:  1    I, Dr. Roma Schanz, DO, personally preformed the services described in this documentation.  All medical record entries made by the scribe were at my direction and in my presence.  I have reviewed the chart and discharge instructions (if applicable) and agree that the record reflects my personal performance and is accurate and complete. 04/02/2021   I,Maurice Jimenez,acting as a Education administrator for [employer], DO.,have documented all relevant documentation on the behalf of [name], DO,as directed by  [name], DO while in the presence of [name], DO.   [name], DO

## 2021-04-03 LAB — COMPREHENSIVE METABOLIC PANEL
ALT: 50 U/L (ref 0–53)
AST: 31 U/L (ref 0–37)
Albumin: 4.8 g/dL (ref 3.5–5.2)
Alkaline Phosphatase: 43 U/L (ref 39–117)
BUN: 19 mg/dL (ref 6–23)
CO2: 29 mEq/L (ref 19–32)
Calcium: 9.8 mg/dL (ref 8.4–10.5)
Chloride: 102 mEq/L (ref 96–112)
Creatinine, Ser: 1.4 mg/dL (ref 0.40–1.50)
GFR: 64.63 mL/min (ref >60.00)
Glucose, Bld: 102 mg/dL — ABNORMAL HIGH (ref 70–99)
Potassium: 4.3 mEq/L (ref 3.5–5.1)
Sodium: 140 mEq/L (ref 135–145)
Total Bilirubin: 0.7 mg/dL (ref 0.2–1.2)
Total Protein: 7.4 g/dL (ref 6.0–8.3)

## 2021-04-03 LAB — LIPID PANEL
Cholesterol: 180 mg/dL (ref 0–200)
HDL: 39.7 mg/dL (ref >39.00)
LDL Cholesterol: 118 mg/dL — ABNORMAL HIGH (ref 0–99)
NonHDL: 140.64
Total CHOL/HDL Ratio: 5
Triglycerides: 111 mg/dL (ref 0.0–149.0)
VLDL: 22.2 mg/dL (ref 0.0–40.0)

## 2021-04-05 ENCOUNTER — Encounter: Payer: Self-pay | Admitting: Family Medicine

## 2021-04-05 NOTE — Assessment & Plan Note (Signed)
Encourage heart healthy diet such as MIND or DASH diet, increase exercise, avoid trans fats, simple carbohydrates and processed foods, consider a krill or fish or flaxseed oil cap daily.  °

## 2021-04-05 NOTE — Assessment & Plan Note (Signed)
Poorly controlled will alter medications, encouraged DASH diet, minimize caffeine and obtain adequate sleep. Report concerning symptoms and follow up as directed and as needed 

## 2021-04-08 ENCOUNTER — Other Ambulatory Visit: Payer: Self-pay | Admitting: Family Medicine

## 2021-06-21 IMAGING — US US SOFT TISSUE
1 series · 12 of 12 positions shown · non-contrast
Comparison: None.

CLINICAL DATA: Right chest mass

EXAM:
ULTRASOUND OF chest SOFT TISSUES
TECHNIQUE: Ultrasound examination of the head and neck soft tissues was
performed in the area of clinical concern.

[Series 1: us soft tissue · 12 acquisitions, 12 frames shown]
[im 1/12]
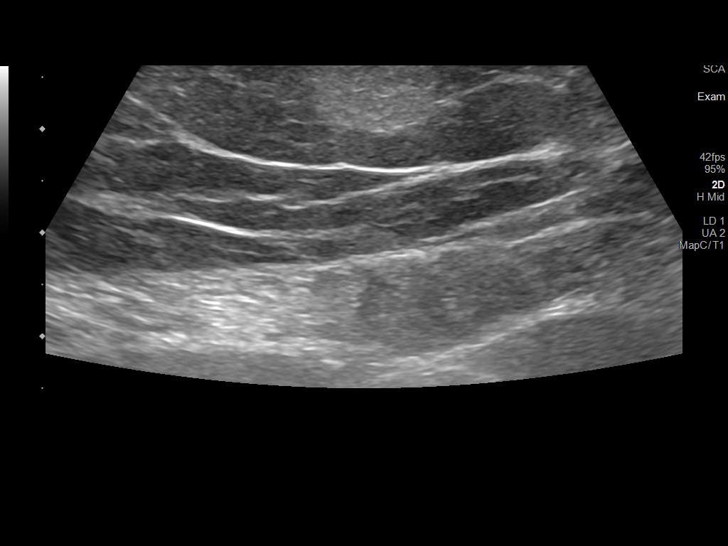
[im 2/12]
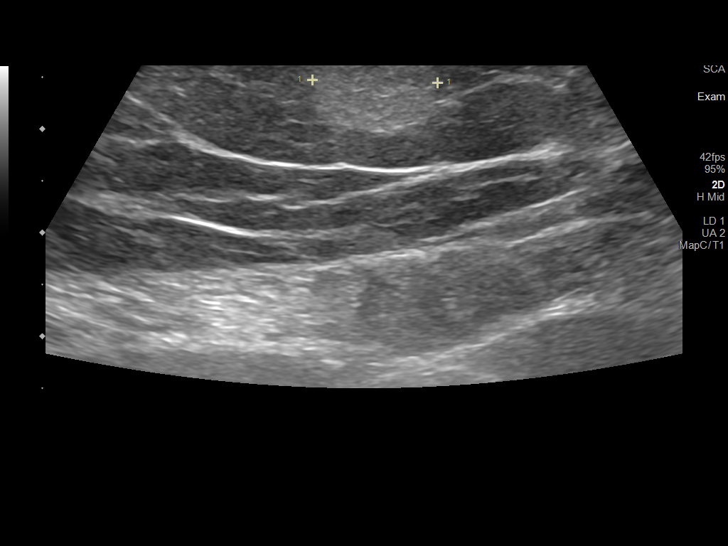
[im 3/12]
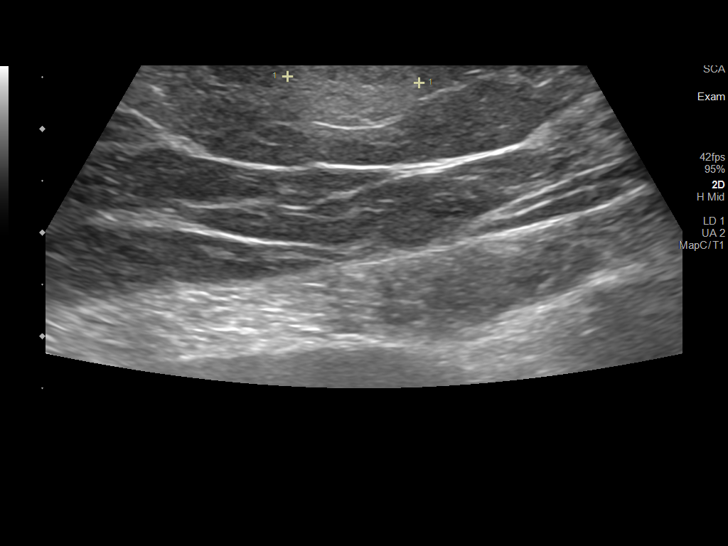
[im 4/12]
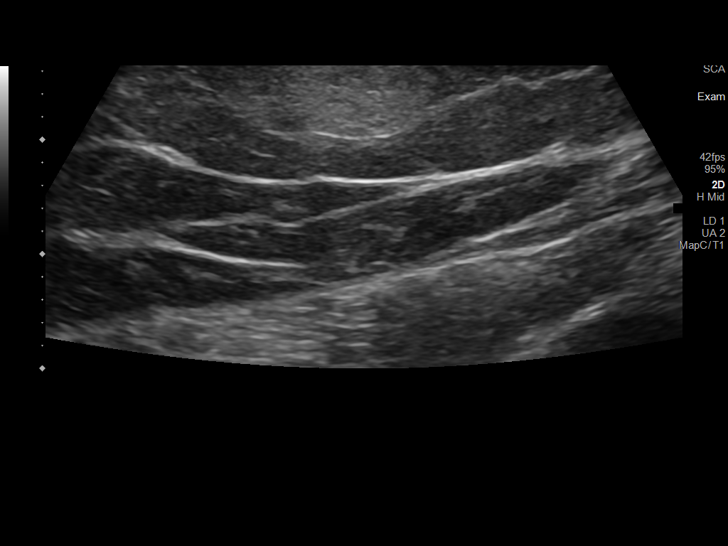
[im 5/12]
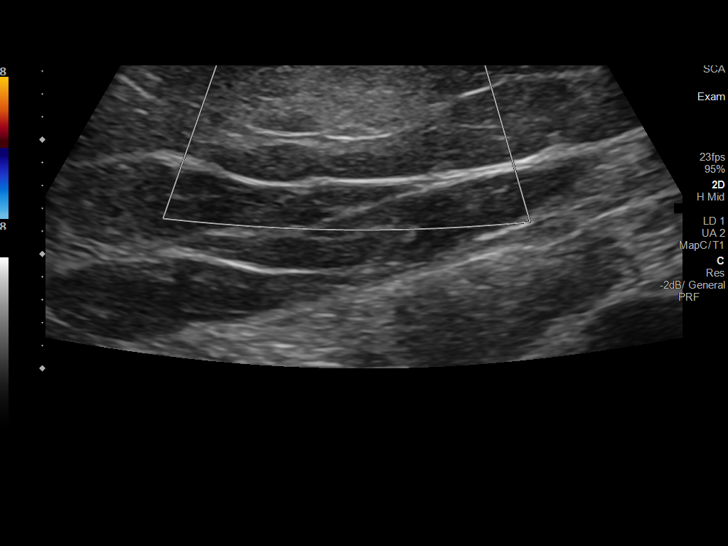
[im 6/12]
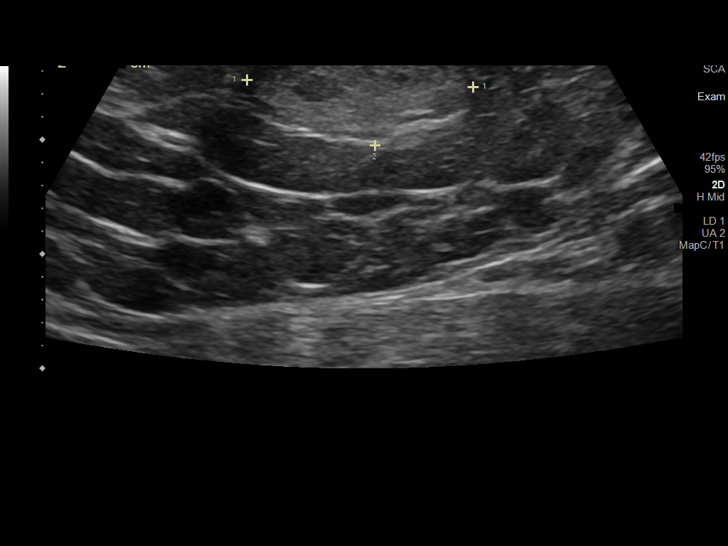
[im 7/12]
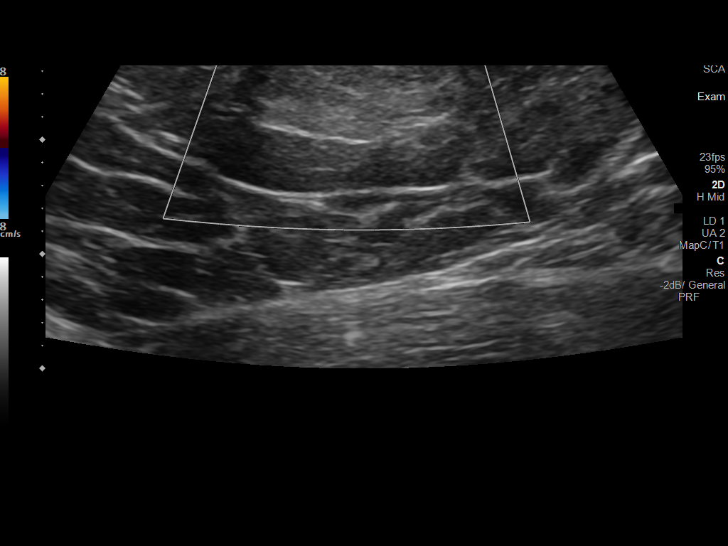
[im 8/12]
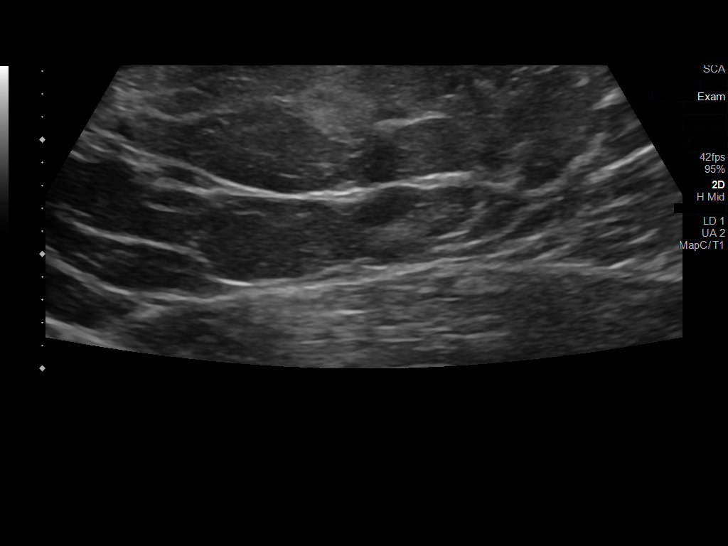
[im 9/12]
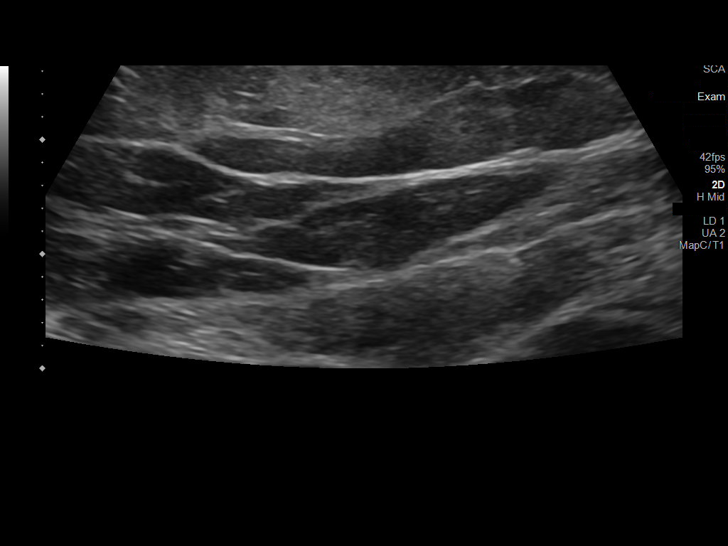
[im 10/12]
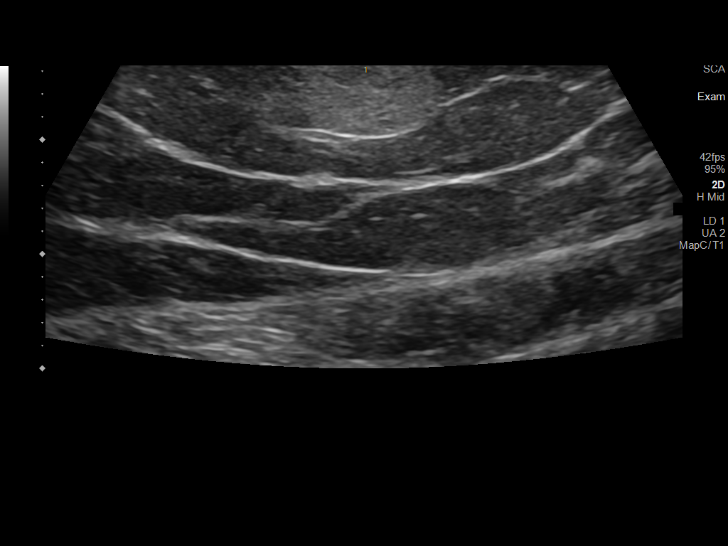
[im 11/12]
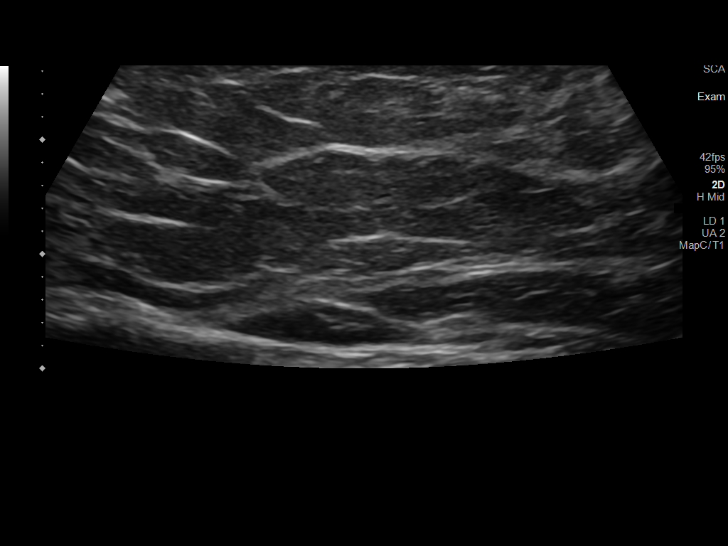
[im 12/12]
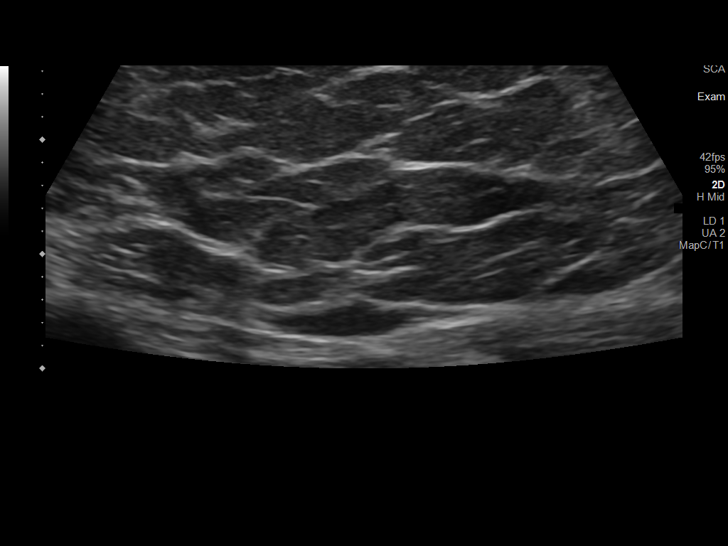

[12 of 12 positions shown; findings below may reference images not displayed]

FINDINGS: Targeted ultrasound of the right chest wall performed in the region
of concern described as the medial chest below the right nipple.

In the region of palpable mass is an echogenic mass without
well-defined borders measuring 2 x 0.8 x 1.2 cm. No appreciable
internal flow.
IMPRESSION: 2 cm echogenic mass in the region of palpable concern. Imaging
features are nonspecific, but could be due to a fatty mass such as a
lipoma. If there is a history of trauma to the region, hematoma
could also be considered. Further management will need to be based
on clinical grounds

## 2021-06-26 ENCOUNTER — Other Ambulatory Visit: Payer: Self-pay | Admitting: Family Medicine

## 2021-06-26 DIAGNOSIS — I10 Essential (primary) hypertension: Secondary | ICD-10-CM

## 2021-06-29 ENCOUNTER — Encounter: Payer: Self-pay | Admitting: Family Medicine

## 2021-07-04 ENCOUNTER — Ambulatory Visit: Payer: 59 | Admitting: Family Medicine

## 2021-07-04 ENCOUNTER — Encounter: Payer: Self-pay | Admitting: Family Medicine

## 2021-07-04 ENCOUNTER — Other Ambulatory Visit: Payer: Self-pay

## 2021-07-04 VITALS — BP 138/81 | HR 74 | Temp 98.9°F | Resp 16 | Wt 292.2 lb

## 2021-07-04 DIAGNOSIS — E785 Hyperlipidemia, unspecified: Secondary | ICD-10-CM

## 2021-07-04 DIAGNOSIS — Z1159 Encounter for screening for other viral diseases: Secondary | ICD-10-CM | POA: Diagnosis not present

## 2021-07-04 DIAGNOSIS — I10 Essential (primary) hypertension: Secondary | ICD-10-CM | POA: Diagnosis not present

## 2021-07-04 LAB — COMPREHENSIVE METABOLIC PANEL
ALT: 48 U/L (ref 0–53)
AST: 26 U/L (ref 0–37)
Albumin: 4.7 g/dL (ref 3.5–5.2)
Alkaline Phosphatase: 35 U/L — ABNORMAL LOW (ref 39–117)
BUN: 17 mg/dL (ref 6–23)
CO2: 30 mEq/L (ref 19–32)
Calcium: 9.9 mg/dL (ref 8.4–10.5)
Chloride: 103 mEq/L (ref 96–112)
Creatinine, Ser: 1.19 mg/dL (ref 0.40–1.50)
GFR: 78.41 mL/min (ref >60.00)
Glucose, Bld: 97 mg/dL (ref 70–99)
Potassium: 4.2 mEq/L (ref 3.5–5.1)
Sodium: 140 mEq/L (ref 135–145)
Total Bilirubin: 0.6 mg/dL (ref 0.2–1.2)
Total Protein: 7.2 g/dL (ref 6.0–8.3)

## 2021-07-04 LAB — LIPID PANEL
Cholesterol: 176 mg/dL (ref 0–200)
HDL: 38 mg/dL — ABNORMAL LOW (ref >39.00)
LDL Cholesterol: 107 mg/dL — ABNORMAL HIGH (ref 0–99)
NonHDL: 138
Total CHOL/HDL Ratio: 5
Triglycerides: 155 mg/dL — ABNORMAL HIGH (ref 0.0–149.0)
VLDL: 31 mg/dL (ref 0.0–40.0)

## 2021-07-04 MED ORDER — LOSARTAN POTASSIUM 100 MG PO TABS: 100.0000 mg | ORAL_TABLET | Freq: Every day | ORAL | 1 refills | Status: DC

## 2021-07-04 NOTE — Progress Notes (Signed)
Subjective:   By signing my name below, I, Larwance Rote, attest that this documentation has been prepared under the direction and in the presence of [name], DO  07/04/2021   Patient ID: Maurice Jimenez, male    DOB: 12-30-1985, 35 y.o.   MRN: [id number]  Chief Complaint  Patient presents with   Hypertension    Here for follow up    HPI Patient is in today for office visit.  He has no acute no complain today.   His blood pressure at office is stable. He continues taking losartan daily 100 mg PO. BP Readings from Last 3 Encounters:  07/04/21 138/81  04/02/21 140/90  09/28/20 122/85    He is not interesting to receive flu vaccine today.  He still have issue with his left sided low back. He had MRI done and have not heard back about the results.   BP Readings from Last 3 Encounters:  07/04/21 138/81  04/02/21 140/90  09/28/20 122/85      Past Medical History:  Diagnosis Date   GERD (gastroesophageal reflux disease)    History of chicken pox    Ingrown toenail     Past Surgical History:  Procedure Laterality Date   IRRIGATION AND DEBRIDEMENT SEBACEOUS CYST     l upper arm   NO PAST SURGERIES      Family History  Problem Relation Age of Onset   Diabetes Other        Paternal side   Healthy Mother        Living   Healthy Father        Living   Diabetes Brother        x1   Healthy Sister        x2   Healthy Son        x1   Healthy Daughter        x1    Social History   Socioeconomic History   Marital status: Married    Spouse name: brooke    Number of children: Not on file   Years of education: Not on file   Highest education level: Not on file  Occupational History   Occupation: help desk    Employer: [employer]  Tobacco Use   Smoking status: Never   Smokeless tobacco: Never  Vaping Use   Vaping Use: Never used  Substance and Sexual Activity   Alcohol use: Yes    Alcohol/week: 0.0 standard drinks    Comment: rare   Drug  use: No   Sexual activity: Yes    Partners: Female  Other Topics Concern   Not on file  Social History Narrative   Not on file   Social Determinants of Health   Financial Resource Strain: Not on file  Food Insecurity: Not on file  Transportation Needs: Not on file  Physical Activity: Not on file  Stress: Not on file  Social Connections: Not on file  Intimate Partner Violence: Not on file    Outpatient Medications Prior to Visit  Medication Sig Dispense Refill   aspirin 325 MG tablet Take 325 mg by mouth daily as needed for mild pain or moderate pain.     cyclobenzaprine (FLEXERIL) 10 MG tablet Take 1 tablet (10 mg total) by mouth 3 (three) times daily as needed for muscle spasms. 30 tablet 0   fenofibrate 160 MG tablet TAKE 1 TABLET BY MOUTH EVERY DAY 90 tablet 1   losartan (COZAAR) 100 MG tablet  Take 1 tablet (100 mg total) by mouth daily. 90 tablet 1   No facility-administered medications prior to visit.    No Known Allergies  Review of Systems  Constitutional:  Negative for chills, fever and malaise/fatigue.  HENT:  Negative for congestion and hearing loss.   Eyes:  Negative for blurred vision and discharge.  Respiratory:  Negative for cough, sputum production and shortness of breath.   Cardiovascular:  Negative for chest pain, palpitations and leg swelling.  Gastrointestinal:  Negative for abdominal pain, blood in stool, constipation, diarrhea, heartburn, nausea and vomiting.  Genitourinary:  Negative for dysuria, frequency, hematuria and urgency.  Musculoskeletal:  Negative for back pain, falls and myalgias.  Skin:  Negative for rash.  Neurological:  Negative for dizziness, sensory change, loss of consciousness, weakness and headaches.  Endo/Heme/Allergies:  Negative for environmental allergies. Does not bruise/bleed easily.  Psychiatric/Behavioral:  Negative for depression and suicidal ideas. The patient is not nervous/anxious and does not have insomnia.        Objective:    Physical Exam Vitals and nursing note reviewed.  Constitutional:      General: He is not in acute distress.    Appearance: Normal appearance. He is not ill-appearing.  HENT:     Head: Normocephalic and atraumatic.     Right Ear: External ear normal.     Left Ear: External ear normal.  Eyes:     Extraocular Movements: Extraocular movements intact.     Pupils: Pupils are equal, round, and reactive to light.  Cardiovascular:     Rate and Rhythm: Normal rate and regular rhythm.     Heart sounds: Normal heart sounds. No murmur heard.   No gallop.  Pulmonary:     Effort: Pulmonary effort is normal. No respiratory distress.     Breath sounds: Normal breath sounds. No wheezing or rales.  Skin:    General: Skin is warm and dry.  Neurological:     Mental Status: He is alert and oriented to person, place, and time.  Psychiatric:        Behavior: Behavior normal.        Judgment: Judgment normal.    BP 138/81 (BP Location: Right Arm, Patient Position: Sitting, Cuff Size: Large)   Pulse 74   Temp 98.9 F (37.2 C) (Oral)   Resp 16   Wt 292 lb 3.2 oz (132.5 kg)   SpO2 99%   BMI 40.75 kg/m  Wt Readings from Last 3 Encounters:  07/04/21 292 lb 3.2 oz (132.5 kg)  04/02/21 295 lb 9.6 oz (134.1 kg)  09/28/20 289 lb (131.1 kg)    Diabetic Foot Exam - Simple   No data filed    Lab Results  Component Value Date   WBC 8.9 01/19/2020   HGB 15.4 01/19/2020   HCT 45.3 01/19/2020   PLT 209.0 01/19/2020   GLUCOSE 102 (H) 04/02/2021   CHOL 180 04/02/2021   TRIG 111.0 04/02/2021   HDL 39.70 04/02/2021   LDLDIRECT 123.0 04/24/2020   LDLCALC 118 (H) 04/02/2021   ALT 50 04/02/2021   AST 31 04/02/2021   NA 140 04/02/2021   K 4.3 04/02/2021   CL 102 04/02/2021   CREATININE 1.40 04/02/2021   BUN 19 04/02/2021   CO2 29 04/02/2021   TSH 0.97 01/19/2020   HGBA1C 5.4 04/24/2020    Lab Results  Component Value Date   TSH 0.97 01/19/2020   Lab Results  Component  Value Date   WBC 8.9 01/19/2020  HGB 15.4 01/19/2020   HCT 45.3 01/19/2020   MCV 90.4 01/19/2020   PLT 209.0 01/19/2020   Lab Results  Component Value Date   NA 140 04/02/2021   K 4.3 04/02/2021   CO2 29 04/02/2021   GLUCOSE 102 (H) 04/02/2021   BUN 19 04/02/2021   CREATININE 1.40 04/02/2021   BILITOT 0.7 04/02/2021   ALKPHOS 43 04/02/2021   AST 31 04/02/2021   ALT 50 04/02/2021   PROT 7.4 04/02/2021   ALBUMIN 4.8 04/02/2021   CALCIUM 9.8 04/02/2021   ANIONGAP 12 11/22/2014   GFR 64.63 04/02/2021   Lab Results  Component Value Date   CHOL 180 04/02/2021   Lab Results  Component Value Date   HDL 39.70 04/02/2021   Lab Results  Component Value Date   LDLCALC 118 (H) 04/02/2021   Lab Results  Component Value Date   TRIG 111.0 04/02/2021   Lab Results  Component Value Date   CHOLHDL 5 04/02/2021   Lab Results  Component Value Date   HGBA1C 5.4 04/24/2020       Assessment & Plan:   Problem List Items Addressed This Visit       Unprioritized   Hyperlipidemia - Primary    Encourage heart healthy diet such as MIND or DASH diet, increase exercise, avoid trans fats, simple carbohydrates and processed foods, consider a krill or fish or flaxseed oil cap daily.       Relevant Medications   losartan (COZAAR) 100 MG tablet   Other Relevant Orders   Lipid panel   Comprehensive metabolic panel   Primary hypertension    .Well controlled, no changes to meds. Encouraged heart healthy diet such as the DASH diet and exercise as tolerated.       Relevant Medications   losartan (COZAAR) 100 MG tablet   Other Relevant Orders   Lipid panel   Comprehensive metabolic panel   Other Visit Diagnoses     Need for hepatitis C screening test       Relevant Orders   Hepatitis C antibody         Meds ordered this encounter  Medications   losartan (COZAAR) 100 MG tablet    Sig: Take 1 tablet (100 mg total) by mouth daily.    Dispense:  90 tablet    Refill:  1     I, [name], DO, personally preformed the services described in this documentation.  All medical record entries made by the scribe were at my direction and in my presence.  I have reviewed the chart and discharge instructions (if applicable) and agree that the record reflects my personal performance and is accurate and complete. 07/04/2021.    I,Savera Zaman,acting as a Education administrator for [employer], DO.,have documented all relevant documentation on the behalf of [name], DO,as directed by  [name], DO while in the presence of [name], DO.    [name], DO

## 2021-07-04 NOTE — Patient Instructions (Addendum)

## 2021-07-04 NOTE — Assessment & Plan Note (Signed)
Well controlled, no changes to meds. Encouraged heart healthy diet such as the DASH diet and exercise as tolerated.  °

## 2021-07-04 NOTE — Assessment & Plan Note (Signed)
Encourage heart healthy diet such as MIND or DASH diet, increase exercise, avoid trans fats, simple carbohydrates and processed foods, consider a krill or fish or flaxseed oil cap daily.  °

## 2021-07-05 LAB — HEPATITIS C ANTIBODY
Hepatitis C Ab: NONREACTIVE
SIGNAL TO CUT-OFF: 0.01 (ref <1.00)

## 2021-07-11 ENCOUNTER — Other Ambulatory Visit: Payer: Self-pay | Admitting: Family Medicine

## 2021-07-11 ENCOUNTER — Other Ambulatory Visit: Payer: Self-pay | Admitting: *Deleted

## 2021-07-11 DIAGNOSIS — E785 Hyperlipidemia, unspecified: Secondary | ICD-10-CM

## 2021-07-11 MED ORDER — ROSUVASTATIN CALCIUM 10 MG PO TABS: 10.0000 mg | ORAL_TABLET | Freq: Every day | ORAL | 2 refills | Status: DC

## 2021-07-12 ENCOUNTER — Encounter: Payer: Self-pay | Admitting: Family Medicine

## 2021-07-29 ENCOUNTER — Other Ambulatory Visit: Payer: Self-pay | Admitting: Family Medicine

## 2021-07-29 DIAGNOSIS — M545 Low back pain, unspecified: Secondary | ICD-10-CM

## 2021-07-29 MED ORDER — CYCLOBENZAPRINE HCL 10 MG PO TABS: 10.0000 mg | ORAL_TABLET | Freq: Three times a day (TID) | ORAL | 0 refills | Status: AC | PRN

## 2021-09-14 ENCOUNTER — Other Ambulatory Visit: Payer: Self-pay | Admitting: Family Medicine

## 2021-09-14 DIAGNOSIS — I10 Essential (primary) hypertension: Secondary | ICD-10-CM

## 2021-09-16 MED ORDER — LOSARTAN POTASSIUM 100 MG PO TABS: 100.0000 mg | ORAL_TABLET | Freq: Every day | ORAL | 1 refills | Status: DC

## 2022-01-02 ENCOUNTER — Ambulatory Visit: Payer: 59 | Admitting: Family Medicine

## 2022-01-02 ENCOUNTER — Telehealth: Payer: Self-pay

## 2022-01-02 ENCOUNTER — Encounter: Payer: Self-pay | Admitting: Family Medicine

## 2022-01-02 VITALS — BP 120/86 | HR 90 | Temp 98.6°F | Resp 18 | Ht 71.0 in | Wt 293.8 lb

## 2022-01-02 DIAGNOSIS — I10 Essential (primary) hypertension: Secondary | ICD-10-CM | POA: Diagnosis not present

## 2022-01-02 DIAGNOSIS — R739 Hyperglycemia, unspecified: Secondary | ICD-10-CM

## 2022-01-02 DIAGNOSIS — E785 Hyperlipidemia, unspecified: Secondary | ICD-10-CM

## 2022-01-02 LAB — CBC WITH DIFFERENTIAL/PLATELET
Basophils Absolute: 0.1 10*3/uL (ref 0.0–0.1)
Basophils Relative: 0.8 % (ref 0.0–3.0)
Eosinophils Absolute: 0.1 10*3/uL (ref 0.0–0.7)
Eosinophils Relative: 2.1 % (ref 0.0–5.0)
HCT: 42.8 % (ref 39.0–52.0)
Hemoglobin: 14.4 g/dL (ref 13.0–17.0)
Lymphocytes Relative: 34.5 % (ref 12.0–46.0)
Lymphs Abs: 2.3 10*3/uL (ref 0.7–4.0)
MCHC: 33.5 g/dL (ref 30.0–36.0)
MCV: 89.5 fl (ref 78.0–100.0)
Monocytes Absolute: 0.5 10*3/uL (ref 0.1–1.0)
Monocytes Relative: 7.4 % (ref 3.0–12.0)
Neutro Abs: 3.7 10*3/uL (ref 1.4–7.7)
Neutrophils Relative %: 55.2 % (ref 43.0–77.0)
Platelets: 218 10*3/uL (ref 150.0–400.0)
RBC: 4.79 Mil/uL (ref 4.22–5.81)
RDW: 13.1 % (ref 11.5–15.5)
WBC: 6.6 10*3/uL (ref 4.0–10.5)

## 2022-01-02 LAB — COMPREHENSIVE METABOLIC PANEL
ALT: 68 U/L — ABNORMAL HIGH (ref 0–53)
AST: 34 U/L (ref 0–37)
Albumin: 4.8 g/dL (ref 3.5–5.2)
Alkaline Phosphatase: 41 U/L (ref 39–117)
BUN: 13 mg/dL (ref 6–23)
CO2: 31 mEq/L (ref 19–32)
Calcium: 9.7 mg/dL (ref 8.4–10.5)
Chloride: 105 mEq/L (ref 96–112)
Creatinine, Ser: 1.17 mg/dL (ref 0.40–1.50)
GFR: 79.74 mL/min (ref >60.00)
Glucose, Bld: 86 mg/dL (ref 70–99)
Potassium: 4.4 mEq/L (ref 3.5–5.1)
Sodium: 142 mEq/L (ref 135–145)
Total Bilirubin: 0.8 mg/dL (ref 0.2–1.2)
Total Protein: 7 g/dL (ref 6.0–8.3)

## 2022-01-02 LAB — HEMOGLOBIN A1C: Hgb A1c MFr Bld: 5.9 % (ref 4.6–6.5)

## 2022-01-02 LAB — LIPID PANEL
Cholesterol: 115 mg/dL (ref 0–200)
HDL: 36.3 mg/dL — ABNORMAL LOW (ref >39.00)
LDL Cholesterol: 59 mg/dL (ref 0–99)
NonHDL: 78.69
Total CHOL/HDL Ratio: 3
Triglycerides: 100 mg/dL (ref 0.0–149.0)
VLDL: 20 mg/dL (ref 0.0–40.0)

## 2022-01-02 MED ORDER — WEGOVY 0.5 MG/0.5ML ~~LOC~~ SOAJ: 0.5000 mg | SUBCUTANEOUS | 0 refills | Status: DC

## 2022-01-02 NOTE — Patient Instructions (Signed)

## 2022-01-02 NOTE — Progress Notes (Signed)
? ?Subjective:  ? ?By signing my name below, I, [name], attest that this documentation has been prepared under the direction and in the presence of Dr. Roma Schanz, DO. 01/02/2022 ? ? ? Patient ID: Samir Ishaq, male    DOB: [birth year], 36 y.o.   MRN: [id number] ? ?Chief Complaint  ?Patient presents with  ? Hypertension  ? Hyperlipidemia  ? Follow-up  ? ? ?Hypertension ?Pertinent negatives include no blurred vision, chest pain, headaches, malaise/fatigue, palpitations or shortness of breath.  ?Hyperlipidemia ?Pertinent negatives include no chest pain or shortness of breath.  ?Patient is in today for a office visit.  ? ?He is interested in trying wegovy to help assist with weight loss. He weighs in at 293 lb's today. He has a current history of hypertension and hyperlipidemia. He is occasionally participating in exercise and is planning to increase that as the weather gets warmer.  ?Wt Readings from Last 3 Encounters:  ?01/02/22 293 lb 12.8 oz (133.3 kg)  ?07/04/21 292 lb 3.2 oz (132.5 kg)  ?04/02/21 295 lb 9.6 oz (134.1 kg)  ? ?His blood pressure is doing well during this visit. He continues taking 100 mg losartan daily PO and reports no new issues while taking it.  ?BP Readings from Last 3 Encounters:  ?01/02/22 120/86  ?07/04/21 138/81  ?04/02/21 140/90  ? ?Pulse Readings from Last 3 Encounters:  ?01/02/22 90  ?07/04/21 74  ?04/02/21 (!) 105  ? ?He is UTD on medication refills at this time.  ?He reports his allergies are worsening due to the season changes. He reports his neighbors have pine trees in their front yard which worsen his allergies.  ?He has no Covid-19 vaccines at this time. He reports contracting Covid-19 a couple of months ago and feeling fatigued.  ? ? ?Past Medical History:  ?Diagnosis Date  ? GERD (gastroesophageal reflux disease)   ? History of chicken pox   ? Ingrown toenail   ? ? ?Past Surgical History:  ?Procedure Laterality Date  ? IRRIGATION AND DEBRIDEMENT SEBACEOUS CYST     ? l upper arm  ? NO PAST SURGERIES    ? ? ?Family History  ?Problem Relation Age of Onset  ? Diabetes Other   ?     Paternal side  ? Healthy Mother   ?     Living  ? Healthy Father   ?     Living  ? Diabetes Brother   ?     x1  ? Healthy Sister   ?     x2  ? Healthy Son   ?     x1  ? Healthy Daughter   ?     x1  ? ? ?Social History  ? ?Socioeconomic History  ? Marital status: Married  ?  Spouse name: brooke   ? Number of children: Not on file  ? Years of education: Not on file  ? Highest education level: Not on file  ?Occupational History  ? Occupation: help desk  ?  Employer: [employer]  ?Tobacco Use  ? Smoking status: Never  ? Smokeless tobacco: Never  ?Vaping Use  ? Vaping Use: Never used  ?Substance and Sexual Activity  ? Alcohol use: Yes  ?  Alcohol/week: 0.0 standard drinks  ?  Comment: rare  ? Drug use: No  ? Sexual activity: Yes  ?  Partners: Female  ?Other Topics Concern  ? Not on file  ?Social History Narrative  ? Not on file  ? ?Social Determinants  of Health  ? ?Financial Resource Strain: Not on file  ?Food Insecurity: Not on file  ?Transportation Needs: Not on file  ?Physical Activity: Not on file  ?Stress: Not on file  ?Social Connections: Not on file  ?Intimate Partner Violence: Not on file  ? ? ?Outpatient Medications Prior to Visit  ?Medication Sig Dispense Refill  ? aspirin 325 MG tablet Take 325 mg by mouth daily as needed for mild pain or moderate pain.    ? cyclobenzaprine (FLEXERIL) 10 MG tablet Take 1 tablet (10 mg total) by mouth 3 (three) times daily as needed for muscle spasms. 30 tablet 0  ? fenofibrate 160 MG tablet TAKE 1 TABLET BY MOUTH EVERY DAY 90 tablet 1  ? losartan (COZAAR) 100 MG tablet Take 1 tablet (100 mg total) by mouth daily. 90 tablet 1  ? rosuvastatin (CRESTOR) 10 MG tablet TAKE 1 TABLET BY MOUTH EVERYDAY AT BEDTIME 90 tablet 1  ? ?No facility-administered medications prior to visit.  ? ? ?No Known Allergies ? ?Review of Systems  ?Constitutional:  Negative for fever and  malaise/fatigue.  ?HENT:  Negative for congestion.   ?Eyes:  Negative for blurred vision.  ?Respiratory:  Negative for shortness of breath.   ?Cardiovascular:  Negative for chest pain, palpitations and leg swelling.  ?Gastrointestinal:  Negative for abdominal pain, blood in stool and nausea.  ?Genitourinary:  Negative for dysuria and frequency.  ?Musculoskeletal:  Negative for falls.  ?Skin:  Negative for rash.  ?Neurological:  Negative for dizziness, loss of consciousness and headaches.  ?Endo/Heme/Allergies:  Negative for environmental allergies.  ?Psychiatric/Behavioral:  Negative for depression. The patient is not nervous/anxious.   ? ?   ?Objective:  ?  ?Physical Exam ?Vitals and nursing note reviewed.  ?Constitutional:   ?   General: He is not in acute distress. ?   Appearance: Normal appearance. He is not ill-appearing.  ?HENT:  ?   Head: Normocephalic and atraumatic.  ?   Right Ear: External ear normal.  ?   Left Ear: External ear normal.  ?Eyes:  ?   Extraocular Movements: Extraocular movements intact.  ?   Pupils: Pupils are equal, round, and reactive to light.  ?Cardiovascular:  ?   Rate and Rhythm: Normal rate and regular rhythm.  ?   Heart sounds: Normal heart sounds. No murmur heard. ?  No gallop.  ?Pulmonary:  ?   Effort: Pulmonary effort is normal. No respiratory distress.  ?   Breath sounds: Normal breath sounds. No wheezing or rales.  ?Skin: ?   General: Skin is warm and dry.  ?Neurological:  ?   Mental Status: He is alert and oriented to person, place, and time.  ?Psychiatric:     ?   Judgment: Judgment normal.  ? ? ?BP 120/86 (BP Location: Right Arm, Patient Position: Sitting, Cuff Size: Large)   Pulse 90   Temp 98.6 ?F (37 ?C) (Oral)   Resp 18   Ht '5\' 11"'$  (1.803 m)   Wt 293 lb 12.8 oz (133.3 kg)   SpO2 99%   BMI 40.98 kg/m?  ?Wt Readings from Last 3 Encounters:  ?01/02/22 293 lb 12.8 oz (133.3 kg)  ?07/04/21 292 lb 3.2 oz (132.5 kg)  ?04/02/21 295 lb 9.6 oz (134.1 kg)  ? ? ?Diabetic Foot  Exam - Simple   ?No data filed ?  ? ?Lab Results  ?Component Value Date  ? WBC 8.9 01/19/2020  ? HGB 15.4 01/19/2020  ? HCT 45.3 01/19/2020  ?  PLT 209.0 01/19/2020  ? GLUCOSE 97 07/04/2021  ? CHOL 176 07/04/2021  ? TRIG 155.0 (H) 07/04/2021  ? HDL 38.00 (L) 07/04/2021  ? LDLDIRECT 123.0 04/24/2020  ? LDLCALC 107 (H) 07/04/2021  ? ALT 48 07/04/2021  ? AST 26 07/04/2021  ? NA 140 07/04/2021  ? K 4.2 07/04/2021  ? CL 103 07/04/2021  ? CREATININE 1.19 07/04/2021  ? BUN 17 07/04/2021  ? CO2 30 07/04/2021  ? TSH 0.97 01/19/2020  ? HGBA1C 5.4 04/24/2020  ? ? ?Lab Results  ?Component Value Date  ? TSH 0.97 01/19/2020  ? ?Lab Results  ?Component Value Date  ? WBC 8.9 01/19/2020  ? HGB 15.4 01/19/2020  ? HCT 45.3 01/19/2020  ? MCV 90.4 01/19/2020  ? PLT 209.0 01/19/2020  ? ?Lab Results  ?Component Value Date  ? NA 140 07/04/2021  ? K 4.2 07/04/2021  ? CO2 30 07/04/2021  ? GLUCOSE 97 07/04/2021  ? BUN 17 07/04/2021  ? CREATININE 1.19 07/04/2021  ? BILITOT 0.6 07/04/2021  ? ALKPHOS 35 (L) 07/04/2021  ? AST 26 07/04/2021  ? ALT 48 07/04/2021  ? PROT 7.2 07/04/2021  ? ALBUMIN 4.7 07/04/2021  ? CALCIUM 9.9 07/04/2021  ? ANIONGAP 12 11/22/2014  ? GFR 78.41 07/04/2021  ? ?Lab Results  ?Component Value Date  ? CHOL 176 07/04/2021  ? ?Lab Results  ?Component Value Date  ? HDL 38.00 (L) 07/04/2021  ? ?Lab Results  ?Component Value Date  ? LDLCALC 107 (H) 07/04/2021  ? ?Lab Results  ?Component Value Date  ? TRIG 155.0 (H) 07/04/2021  ? ?Lab Results  ?Component Value Date  ? CHOLHDL 5 07/04/2021  ? ?Lab Results  ?Component Value Date  ? HGBA1C 5.4 04/24/2020  ? ? ?   ?Assessment & Plan:  ? ?Problem List Items Addressed This Visit   ? ?  ? Unprioritized  ? Hyperglycemia  ?  Check labs  ?  ?  ? Relevant Orders  ? CBC with Differential/Platelet  ? Comprehensive metabolic panel  ? Lipid panel  ? Hemoglobin A1c  ? Hyperlipidemia  ?  Tolerating statin, encouraged heart healthy diet, avoid trans fats, minimize simple carbs and saturated fats.  Increase exercise as tolerated ?  ?  ? Relevant Orders  ? CBC with Differential/Platelet  ? Comprehensive metabolic panel  ? Lipid panel  ? Morbid obesity ([place])  ?  Pt with hx htn and high cholesterol  ?Pt qualif

## 2022-01-02 NOTE — Assessment & Plan Note (Signed)
Check labs 

## 2022-01-02 NOTE — Telephone Encounter (Signed)
PA initiated via Covermymeds; KEY: [id number]. Awaiting determination.  ?

## 2022-01-02 NOTE — Assessment & Plan Note (Signed)
Pt with hx htn and high cholesterol  ?Pt qualifies for wegovy  ?0.25 sq sample given to pt  ?0.5 mg sent to pharmacy ?rto 1 month  ?

## 2022-01-02 NOTE — Assessment & Plan Note (Signed)
Well controlled, no changes to meds. Encouraged heart healthy diet such as the DASH diet and exercise as tolerated.  °

## 2022-01-02 NOTE — Assessment & Plan Note (Signed)
Tolerating statin, encouraged heart healthy diet, avoid trans fats, minimize simple carbs and saturated fats. Increase exercise as tolerated 

## 2022-01-03 NOTE — Telephone Encounter (Signed)
PA approved.  ? ? Your PA request has been approved. Additional information will be provided in the approval communication. Tech review/ normal priority/ Wegovy 0.5 MG/0.5ML 2 for 28 days/ E66.09 Other obesity due to excess calories /75 Prior Authorization Required Prior Auth Req-MD call [id number]. Drug Requires Prior Authorization. Meets gl. Approve, 7 months ?

## 2022-01-04 ENCOUNTER — Encounter: Payer: Self-pay | Admitting: Family Medicine

## 2022-01-30 ENCOUNTER — Ambulatory Visit: Payer: 59 | Admitting: Family Medicine

## 2022-02-04 ENCOUNTER — Encounter: Payer: Self-pay | Admitting: Family Medicine

## 2022-02-04 ENCOUNTER — Ambulatory Visit: Payer: 59 | Admitting: Family Medicine

## 2022-02-04 MED ORDER — WEGOVY 1 MG/0.5ML ~~LOC~~ SOAJ: 1.0000 mg | SUBCUTANEOUS | 2 refills | Status: DC

## 2022-02-04 NOTE — Patient Instructions (Signed)
Calorie Counting for Weight Loss ?Calories are units of energy. Your body needs a certain number of calories from food to keep going throughout the day. When you eat or drink more calories than your body needs, your body stores the extra calories mostly as fat. When you eat or drink fewer calories than your body needs, your body burns fat to get the energy it needs. ?Calorie counting means keeping track of how many calories you eat and drink each day. Calorie counting can be helpful if you need to lose weight. If you eat fewer calories than your body needs, you should lose weight. Ask your health care provider what a healthy weight is for you. ?For calorie counting to work, you will need to eat the right number of calories each day to lose a healthy amount of weight per week. A dietitian can help you figure out how many calories you need in a day and will suggest ways to reach your calorie goal. ?A healthy amount of weight to lose each week is usually 1-2 lb (0.5-0.9 kg). This usually means that your daily calorie intake should be reduced by 500-750 calories. ?Eating 1,200-1,500 calories a day can help most women lose weight. ?Eating 1,500-1,800 calories a day can help most men lose weight. ?What do I need to know about calorie counting? ?Work with your health care provider or dietitian to determine how many calories you should get each day. To meet your daily calorie goal, you will need to: ?Find out how many calories are in each food that you would like to eat. Try to do this before you eat. ?Decide how much of the food you plan to eat. ?Keep a food log. Do this by writing down what you ate and how many calories it had. ?To successfully lose weight, it is important to balance calorie counting with a healthy lifestyle that includes regular activity. ?Where do I find calorie information? ? ?The number of calories in a food can be found on a Nutrition Facts label. If a food does not have a Nutrition Facts label, try  to look up the calories online or ask your dietitian for help. ?Remember that calories are listed per serving. If you choose to have more than one serving of a food, you will have to multiply the calories per serving by the number of servings you plan to eat. For example, the label on a package of bread might say that a serving size is 1 slice and that there are 90 calories in a serving. If you eat 1 slice, you will have eaten 90 calories. If you eat 2 slices, you will have eaten 180 calories. ?How do I keep a food log? ?After each time that you eat, record the following in your food log as soon as possible: ?What you ate. Be sure to include toppings, sauces, and other extras on the food. ?How much you ate. This can be measured in cups, ounces, or number of items. ?How many calories were in each food and drink. ?The total number of calories in the food you ate. ?Keep your food log near you, such as in a pocket-sized notebook or on an app or website on your mobile phone. Some programs will calculate calories for you and show you how many calories you have left to meet your daily goal. ?What are some portion-control tips? ?Know how many calories are in a serving. This will help you know how many servings you can have of a certain  food. ?Use a measuring cup to measure serving sizes. You could also try weighing out portions on a kitchen scale. With time, you will be able to estimate serving sizes for some foods. ?Take time to put servings of different foods on your favorite plates or in your favorite bowls and cups so you know what a serving looks like. ?Try not to eat straight from a food's packaging, such as from a bag or box. Eating straight from the package makes it hard to see how much you are eating and can lead to overeating. Put the amount you would like to eat in a cup or on a plate to make sure you are eating the right portion. ?Use smaller plates, glasses, and bowls for smaller portions and to prevent  overeating. ?Try not to multitask. For example, avoid watching TV or using your computer while eating. If it is time to eat, sit down at a table and enjoy your food. This will help you recognize when you are full. It will also help you be more mindful of what and how much you are eating. ?What are tips for following this plan? ?Reading food labels ?Check the calorie count compared with the serving size. The serving size may be smaller than what you are used to eating. ?Check the source of the calories. Try to choose foods that are high in protein, fiber, and vitamins, and low in saturated fat, trans fat, and sodium. ?Shopping ?Read nutrition labels while you shop. This will help you make healthy decisions about which foods to buy. ?Pay attention to nutrition labels for low-fat or fat-free foods. These foods sometimes have the same number of calories or more calories than the full-fat versions. They also often have added sugar, starch, or salt to make up for flavor that was removed with the fat. ?Make a grocery list of lower-calorie foods and stick to it. ?Cooking ?Try to cook your favorite foods in a healthier way. For example, try baking instead of frying. ?Use low-fat dairy products. ?Meal planning ?Use more fruits and vegetables. One-half of your plate should be fruits and vegetables. ?Include lean proteins, such as chicken, [place], and fish. ?Lifestyle ?Each week, aim to do one of the following: ?150 minutes of moderate exercise, such as walking. ?75 minutes of vigorous exercise, such as running. ?General information ?Know how many calories are in the foods you eat most often. This will help you calculate calorie counts faster. ?Find a way of tracking calories that works for you. Get creative. Try different apps or programs if writing down calories does not work for you. ?What foods should I eat? ? ?Eat nutritious foods. It is better to have a nutritious, high-calorie food, such as an avocado, than a food with  few nutrients, such as a bag of potato chips. ?Use your calories on foods and drinks that will fill you up and will not leave you hungry soon after eating. ?Examples of foods that fill you up are nuts and nut butters, vegetables, lean proteins, and high-fiber foods such as whole grains. High-fiber foods are foods with more than 5 g of fiber per serving. ?Pay attention to calories in drinks. Low-calorie drinks include water and unsweetened drinks. ?The items listed above may not be a complete list of foods and beverages you can eat. Contact a dietitian for more information. ?What foods should I limit? ?Limit foods or drinks that are not good sources of vitamins, minerals, or protein or that are high in unhealthy fats. These  include: ?Candy. ?Other sweets. ?Sodas, specialty coffee drinks, alcohol, and juice. ?The items listed above may not be a complete list of foods and beverages you should avoid. Contact a dietitian for more information. ?How do I count calories when eating out? ?Pay attention to portions. Often, portions are much larger when eating out. Try these tips to keep portions smaller: ?Consider sharing a meal instead of getting your own. ?If you get your own meal, eat only half of it. Before you start eating, ask for a container and put half of your meal into it. ?When available, consider ordering smaller portions from the menu instead of full portions. ?Pay attention to your food and drink choices. Knowing the way food is cooked and what is included with the meal can help you eat fewer calories. ?If calories are listed on the menu, choose the lower-calorie options. ?Choose dishes that include vegetables, fruits, whole grains, low-fat dairy products, and lean proteins. ?Choose items that are boiled, broiled, grilled, or steamed. Avoid items that are buttered, battered, fried, or served with cream sauce. Items labeled as crispy are usually fried, unless stated otherwise. ?Choose water, low-fat milk,  unsweetened iced tea, or other drinks without added sugar. If you want an alcoholic beverage, choose a lower-calorie option, such as a glass of wine or light beer. ?Ask for dressings, sauces, and syrups on the side.

## 2022-02-04 NOTE — Assessment & Plan Note (Signed)
con't diet and exercise ?con't wegovy----  1 mg sent in ?F/u 3 months or sooner prn  ?

## 2022-02-04 NOTE — Progress Notes (Signed)
3 ? ?Subjective:  ? ?By signing my name below, I, Maurice Jimenez, attest that this documentation has been prepared under the direction and in the presence of Maurice Held, DO  02/04/2022 ? ? ? Patient ID: Maurice Jimenez, male    DOB: 30-Nov-1985, 36 y.o.   MRN: [id number] ? ?Chief Complaint  ?Patient presents with  ? Weight Check  ? ? ?HPI ?Patient is in today for a follow up visit.  ? ?He started taking 0.5 mg wegovy last week and reports no new issues while taking it. He has lost 5 lb's since last month. He weighs 288 lb's and has a BMI of 40.17 kg/m^2 during this visit. He continues exercising regularly by walking.  ?Wt Readings from Last 3 Encounters:  ?02/04/22 288 lb (130.6 kg)  ?01/02/22 293 lb 12.8 oz (133.3 kg)  ?07/04/21 292 lb 3.2 oz (132.5 kg)  ? ?His blood pressure is doing well during this visit. He continues taking 100 mg losartan daily PO and reports no new issues while taking it.  ?BP Readings from Last 3 Encounters:  ?02/04/22 118/78  ?01/02/22 120/86  ?07/04/21 138/81  ? ?Pulse Readings from Last 3 Encounters:  ?02/04/22 82  ?01/02/22 90  ?07/04/21 74  ? ? ?Past Medical History:  ?Diagnosis Date  ? GERD (gastroesophageal reflux disease)   ? History of chicken pox   ? Ingrown toenail   ? ? ?Past Surgical History:  ?Procedure Laterality Date  ? IRRIGATION AND DEBRIDEMENT SEBACEOUS CYST    ? l upper arm  ? NO PAST SURGERIES    ? ? ?Family History  ?Problem Relation Age of Onset  ? Diabetes Other   ?     Paternal side  ? Healthy Mother   ?     Living  ? Healthy Father   ?     Living  ? Diabetes Brother   ?     x1  ? Healthy Sister   ?     x2  ? Healthy Son   ?     x1  ? Healthy Daughter   ?     x1  ? ? ?Social History  ? ?Socioeconomic History  ? Marital status: Married  ?  Spouse name: Maurice Jimenez   ? Number of children: Not on file  ? Years of education: Not on file  ? Highest education level: Not on file  ?Occupational History  ? Occupation: help desk  ?  Employer: [employer]  ?Tobacco Use  ?  Smoking status: Never  ? Smokeless tobacco: Never  ?Vaping Use  ? Vaping Use: Never used  ?Substance and Sexual Activity  ? Alcohol use: Yes  ?  Alcohol/week: 0.0 standard drinks  ?  Comment: rare  ? Drug use: No  ? Sexual activity: Yes  ?  Partners: Female  ?Other Topics Concern  ? Not on file  ?Social History Narrative  ? Not on file  ? ?Social Determinants of Health  ? ?Financial Resource Strain: Not on file  ?Food Insecurity: Not on file  ?Transportation Needs: Not on file  ?Physical Activity: Not on file  ?Stress: Not on file  ?Social Connections: Not on file  ?Intimate Partner Violence: Not on file  ? ? ?Outpatient Medications Prior to Visit  ?Medication Sig Dispense Refill  ? aspirin 325 MG tablet Take 325 mg by mouth daily as needed for mild pain or moderate pain.    ? cyclobenzaprine (FLEXERIL) 10 MG tablet Take 1 tablet (10  mg total) by mouth 3 (three) times daily as needed for muscle spasms. 30 tablet 0  ? fenofibrate 160 MG tablet TAKE 1 TABLET BY MOUTH EVERY DAY 90 tablet 1  ? losartan (COZAAR) 100 MG tablet Take 1 tablet (100 mg total) by mouth daily. 90 tablet 1  ? rosuvastatin (CRESTOR) 10 MG tablet TAKE 1 TABLET BY MOUTH EVERYDAY AT BEDTIME 90 tablet 1  ? Semaglutide-Weight Management (WEGOVY) 0.5 MG/0.5ML SOAJ Inject 0.5 mg into the skin once a week. 2 mL 0  ? ?No facility-administered medications prior to visit.  ? ? ?No Known Allergies ? ?Review of Systems  ?Constitutional:  Negative for fever and malaise/fatigue.  ?HENT:  Negative for congestion.   ?Eyes:  Negative for blurred vision.  ?Respiratory:  Negative for shortness of breath.   ?Cardiovascular:  Negative for chest pain, palpitations and leg swelling.  ?Gastrointestinal:  Negative for abdominal pain, blood in stool and nausea.  ?Genitourinary:  Negative for dysuria and frequency.  ?Musculoskeletal:  Negative for falls.  ?Skin:  Negative for rash.  ?Neurological:  Negative for dizziness, loss of consciousness and headaches.   ?Endo/Heme/Allergies:  Negative for environmental allergies.  ?Psychiatric/Behavioral:  Negative for depression. The patient is not nervous/anxious.   ? ?   ?Objective:  ?  ?Physical Exam ?Vitals and nursing note reviewed.  ?Constitutional:   ?   General: He is not in acute distress. ?   Appearance: Normal appearance. He is not ill-appearing.  ?HENT:  ?   Head: Normocephalic and atraumatic.  ?   Right Ear: External ear normal.  ?   Left Ear: External ear normal.  ?Eyes:  ?   Extraocular Movements: Extraocular movements intact.  ?   Pupils: Pupils are equal, round, and reactive to light.  ?Cardiovascular:  ?   Rate and Rhythm: Normal rate and regular rhythm.  ?   Heart sounds: Normal heart sounds. No murmur heard. ?  No gallop.  ?Pulmonary:  ?   Effort: Pulmonary effort is normal. No respiratory distress.  ?   Breath sounds: Normal breath sounds. No wheezing or rales.  ?Skin: ?   General: Skin is warm and dry.  ?Neurological:  ?   Mental Status: He is alert and oriented to person, place, and time.  ?Psychiatric:     ?   Judgment: Judgment normal.  ? ? ?BP 118/78 (BP Location: Left Arm, Patient Position: Sitting, Cuff Size: Large)   Pulse 82   Temp 98 ?F (36.7 ?C) (Oral)   Resp 18   Ht '5\' 11"'$  (4.270 m)   Wt 288 lb (130.6 kg)   SpO2 97%   BMI 40.17 kg/m?  ?Wt Readings from Last 3 Encounters:  ?02/04/22 288 lb (130.6 kg)  ?01/02/22 293 lb 12.8 oz (133.3 kg)  ?07/04/21 292 lb 3.2 oz (132.5 kg)  ? ? ?Diabetic Foot Exam - Simple   ?No data filed ?  ? ?Lab Results  ?Component Value Date  ? WBC 6.6 01/02/2022  ? HGB 14.4 01/02/2022  ? HCT 42.8 01/02/2022  ? PLT 218.0 01/02/2022  ? GLUCOSE 86 01/02/2022  ? CHOL 115 01/02/2022  ? TRIG 100.0 01/02/2022  ? HDL 36.30 (L) 01/02/2022  ? LDLDIRECT 123.0 04/24/2020  ? [place] 59 01/02/2022  ? ALT 68 (H) 01/02/2022  ? AST 34 01/02/2022  ? NA 142 01/02/2022  ? K 4.4 01/02/2022  ? CL 105 01/02/2022  ? CREATININE 1.17 01/02/2022  ? BUN 13 01/02/2022  ? CO2 31 01/02/2022  ?  TSH  0.97 01/19/2020  ? HGBA1C 5.9 01/02/2022  ? ? ?Lab Results  ?Component Value Date  ? TSH 0.97 01/19/2020  ? ?Lab Results  ?Component Value Date  ? WBC 6.6 01/02/2022  ? HGB 14.4 01/02/2022  ? HCT 42.8 01/02/2022  ? MCV 89.5 01/02/2022  ? PLT 218.0 01/02/2022  ? ?Lab Results  ?Component Value Date  ? NA 142 01/02/2022  ? K 4.4 01/02/2022  ? CO2 31 01/02/2022  ? GLUCOSE 86 01/02/2022  ? BUN 13 01/02/2022  ? CREATININE 1.17 01/02/2022  ? BILITOT 0.8 01/02/2022  ? ALKPHOS 41 01/02/2022  ? AST 34 01/02/2022  ? ALT 68 (H) 01/02/2022  ? PROT 7.0 01/02/2022  ? ALBUMIN 4.8 01/02/2022  ? CALCIUM 9.7 01/02/2022  ? ANIONGAP 12 11/22/2014  ? GFR 79.74 01/02/2022  ? ?Lab Results  ?Component Value Date  ? CHOL 115 01/02/2022  ? ?Lab Results  ?Component Value Date  ? HDL 36.30 (L) 01/02/2022  ? ?Lab Results  ?Component Value Date  ? [place] 59 01/02/2022  ? ?Lab Results  ?Component Value Date  ? TRIG 100.0 01/02/2022  ? ?Lab Results  ?Component Value Date  ? CHOLHDL 3 01/02/2022  ? ?Lab Results  ?Component Value Date  ? HGBA1C 5.9 01/02/2022  ? ? ?   ?Assessment & Plan:  ? ?Problem List Items Addressed This Visit   ? ?  ? Unprioritized  ? Morbid obesity ([place]) - Primary  ?  con't diet and exercise ?con't wegovy----  1 mg sent in ?F/u 3 months or sooner prn  ? ?  ?  ? Relevant Medications  ? Semaglutide-Weight Management (WEGOVY) 1 MG/0.5ML SOAJ  ? ? ? ?Meds ordered this encounter  ?Medications  ? Semaglutide-Weight Management (WEGOVY) 1 MG/0.5ML SOAJ  ?  Sig: Inject 1 mg into the skin once a week.  ?  Dispense:  2 mL  ?  Refill:  2  ? ? ?I, Maurice Held, DO, personally preformed the services described in this documentation.  All medical record entries made by the scribe were at my direction and in my presence.  I have reviewed the chart and discharge instructions (if applicable) and agree that the record reflects my personal performance and is accurate and complete. 02/04/2022 ? ? ?Engineering geologist as a Education administrator for  [employer], DO.,have documented all relevant documentation on the behalf of Maurice Held, DO,as directed by  Maurice Held, DO while in the presence of Maurice Held, DO. ? ? ?Maurice Held, DO ?

## 2022-03-18 ENCOUNTER — Encounter: Payer: Self-pay | Admitting: Family Medicine

## 2022-03-18 MED ORDER — WEGOVY 1.7 MG/0.75ML ~~LOC~~ SOAJ: 1.7000 mg | SUBCUTANEOUS | 0 refills | Status: DC

## 2022-03-19 ENCOUNTER — Other Ambulatory Visit: Payer: Self-pay | Admitting: Family Medicine

## 2022-03-19 NOTE — Telephone Encounter (Signed)
On back order. 

## 2022-03-30 ENCOUNTER — Other Ambulatory Visit: Payer: Self-pay | Admitting: Family Medicine

## 2022-05-06 ENCOUNTER — Encounter: Payer: Self-pay | Admitting: Family Medicine

## 2022-05-06 ENCOUNTER — Other Ambulatory Visit (HOSPITAL_BASED_OUTPATIENT_CLINIC_OR_DEPARTMENT_OTHER): Payer: Self-pay

## 2022-05-06 ENCOUNTER — Encounter (HOSPITAL_BASED_OUTPATIENT_CLINIC_OR_DEPARTMENT_OTHER): Payer: Self-pay

## 2022-05-06 ENCOUNTER — Telehealth (INDEPENDENT_AMBULATORY_CARE_PROVIDER_SITE_OTHER): Payer: 59 | Admitting: Family Medicine

## 2022-05-06 MED ORDER — WEGOVY 1.7 MG/0.75ML ~~LOC~~ SOAJ
1.7000 mg | SUBCUTANEOUS | 0 refills | Status: DC
  Filled 2022-05-06: qty 3, 28d supply, fill #0

## 2022-05-06 NOTE — Progress Notes (Signed)
MyChart Video Visit    Virtual Visit via Video Note   This visit type was conducted due to national recommendations for restrictions regarding the COVID-19 Pandemic (e.g. social distancing) in an effort to limit this patient's exposure and mitigate transmission in our community. This patient is at least at moderate risk for complications without adequate follow up. This format is felt to be most appropriate for this patient at this time. Physical exam was limited by quality of the video and audio technology used for the visit. Alinda Dooms was able to get the patient set up on a video visit.  Patient location: Home Patient and provider in visit Provider location: Office  I discussed the limitations of evaluation and management by telemedicine and the availability of in person appointments. The patient expressed understanding and agreed to proceed.  Visit Date: 05/06/2022  Today's healthcare provider: [name], DO     Subjective:    Patient ID: Maurice Jimenez, male    DOB: 10/31/1985, 36 y.o.   MRN: [id number]  No chief complaint on file.   HPI Patient is in today for video visit.   He has not been able to receive his [facility] prescription due to it being on back order. He has tried multiple pharmacies but none had stock. He reports weighing 280.02 lb's this morning. His last Wegovy dose was over a month ago.  His blood pressure measured 133/86 with a pulse of 65 bpm this morning.    Past Medical History:  Diagnosis Date  . GERD (gastroesophageal reflux disease)   . History of chicken pox   . Ingrown toenail     Past Surgical History:  Procedure Laterality Date  . IRRIGATION AND DEBRIDEMENT SEBACEOUS CYST     l upper arm  . NO PAST SURGERIES      Family History  Problem Relation Age of Onset  . Diabetes Other        Paternal side  . Healthy Mother        Living  . Healthy Father        Living  . Diabetes Brother        x1  . Healthy Sister         x2  . Healthy Son        x1  . Healthy Daughter        x1    Social History   Socioeconomic History  . Marital status: Married    Spouse name: brooke   . Number of children: Not on file  . Years of education: Not on file  . Highest education level: Not on file  Occupational History  . Occupation: help desk    Employer: [employer]  Tobacco Use  . Smoking status: Never  . Smokeless tobacco: Never  Vaping Use  . Vaping Use: Never used  Substance and Sexual Activity  . Alcohol use: Yes    Alcohol/week: 0.0 standard drinks of alcohol    Comment: rare  . Drug use: No  . Sexual activity: Yes    Partners: Female  Other Topics Concern  . Not on file  Social History Narrative  . Not on file   Social Determinants of Health   Financial Resource Strain: Not on file  Food Insecurity: Not on file  Transportation Needs: Not on file  Physical Activity: Not on file  Stress: Not on file  Social Connections: Not on file  Intimate Partner Violence: Not on file  Outpatient Medications Prior to Visit  Medication Sig Dispense Refill  . aspirin 325 MG tablet Take 325 mg by mouth daily as needed for mild pain or moderate pain.    . cyclobenzaprine (FLEXERIL) 10 MG tablet Take 1 tablet (10 mg total) by mouth 3 (three) times daily as needed for muscle spasms. 30 tablet 0  . fenofibrate 160 MG tablet TAKE 1 TABLET BY MOUTH EVERY DAY 90 tablet 1  . losartan (COZAAR) 100 MG tablet Take 1 tablet (100 mg total) by mouth daily. 90 tablet 1  . rosuvastatin (CRESTOR) 10 MG tablet TAKE 1 TABLET BY MOUTH EVERYDAY AT BEDTIME 90 tablet 1  . Semaglutide-Weight Management (WEGOVY) 1.7 MG/0.75ML SOAJ Inject 1.7 mg into the skin once a week. 3 mL 0   No facility-administered medications prior to visit.    No Known Allergies  Review of Systems  Constitutional:  Negative for chills, fever and malaise/fatigue.  HENT:  Negative for congestion and hearing loss.   Eyes:  Negative for discharge.   Respiratory:  Negative for cough, sputum production and shortness of breath.   Cardiovascular:  Negative for chest pain, palpitations and leg swelling.  Gastrointestinal:  Negative for abdominal pain, blood in stool, constipation, diarrhea, heartburn, nausea and vomiting.  Genitourinary:  Negative for dysuria, frequency, hematuria and urgency.  Musculoskeletal:  Negative for back pain, falls and myalgias.  Skin:  Negative for rash.  Neurological:  Negative for dizziness, sensory change, loss of consciousness, weakness and headaches.  Endo/Heme/Allergies:  Negative for environmental allergies. Does not bruise/bleed easily.  Psychiatric/Behavioral:  Negative for depression and suicidal ideas. The patient is not nervous/anxious and does not have insomnia.       Objective:    Physical Exam Vitals and nursing note reviewed.  Psychiatric:        Mood and Affect: Mood normal.        Behavior: Behavior normal.        Thought Content: Thought content normal.        Judgment: Judgment normal.   There were no vitals taken for this visit. Wt Readings from Last 3 Encounters:  02/04/22 288 lb (130.6 kg)  01/02/22 293 lb 12.8 oz (133.3 kg)  07/04/21 292 lb 3.2 oz (132.5 kg)    Diabetic Foot Exam - Simple   No data filed    Lab Results  Component Value Date   WBC 6.6 01/02/2022   HGB 14.4 01/02/2022   HCT 42.8 01/02/2022   PLT 218.0 01/02/2022   GLUCOSE 86 01/02/2022   CHOL 115 01/02/2022   TRIG 100.0 01/02/2022   HDL 36.30 (L) 01/02/2022   LDLDIRECT 123.0 04/24/2020   LDLCALC 59 01/02/2022   ALT 68 (H) 01/02/2022   AST 34 01/02/2022   NA 142 01/02/2022   K 4.4 01/02/2022   CL 105 01/02/2022   CREATININE 1.17 01/02/2022   BUN 13 01/02/2022   CO2 31 01/02/2022   TSH 0.97 01/19/2020   HGBA1C 5.9 01/02/2022    Lab Results  Component Value Date   TSH 0.97 01/19/2020   Lab Results  Component Value Date   WBC 6.6 01/02/2022   HGB 14.4 01/02/2022   HCT 42.8 01/02/2022    MCV 89.5 01/02/2022   PLT 218.0 01/02/2022   Lab Results  Component Value Date   NA 142 01/02/2022   K 4.4 01/02/2022   CO2 31 01/02/2022   GLUCOSE 86 01/02/2022   BUN 13 01/02/2022   CREATININE 1.17 01/02/2022   BILITOT  0.8 01/02/2022   ALKPHOS 41 01/02/2022   AST 34 01/02/2022   ALT 68 (H) 01/02/2022   PROT 7.0 01/02/2022   ALBUMIN 4.8 01/02/2022   CALCIUM 9.7 01/02/2022   ANIONGAP 12 11/22/2014   GFR 79.74 01/02/2022   Lab Results  Component Value Date   CHOL 115 01/02/2022   Lab Results  Component Value Date   HDL 36.30 (L) 01/02/2022   Lab Results  Component Value Date   LDLCALC 59 01/02/2022   Lab Results  Component Value Date   TRIG 100.0 01/02/2022   Lab Results  Component Value Date   CHOLHDL 3 01/02/2022   Lab Results  Component Value Date   HGBA1C 5.9 01/02/2022       Assessment & Plan:   Problem List Items Addressed This Visit   None    No orders of the defined types were placed in this encounter.   I discussed the assessment and treatment plan with the patient. The patient was provided an opportunity to ask questions and all were answered. The patient agreed with the plan and demonstrated an understanding of the instructions.   The patient was advised to call back or seek an in-person evaluation if the symptoms worsen or if the condition fails to improve as anticipated.  I provided 20 minutes of face-to-face time during this encounter.  I,[name],acting as a Education administrator for [employer], DO.,have documented all relevant documentation on the behalf of [name], DO,as directed by  [name], DO while in the presence of [name], DO.  [name], DO [place] at [employer] [phone] (phone) [phone] (fax)  [place]

## 2022-05-08 ENCOUNTER — Other Ambulatory Visit (HOSPITAL_BASED_OUTPATIENT_CLINIC_OR_DEPARTMENT_OTHER): Payer: Self-pay

## 2022-05-30 ENCOUNTER — Encounter: Payer: Self-pay | Admitting: Family Medicine

## 2022-06-02 ENCOUNTER — Other Ambulatory Visit (HOSPITAL_BASED_OUTPATIENT_CLINIC_OR_DEPARTMENT_OTHER): Payer: Self-pay

## 2022-06-02 MED ORDER — WEGOVY 2.4 MG/0.75ML ~~LOC~~ SOAJ
2.4000 mg | SUBCUTANEOUS | 3 refills | Status: DC
  Filled 2022-06-02: qty 3, 28d supply, fill #0
  Filled 2022-06-27: qty 3, 28d supply, fill #1
  Filled 2022-07-28: qty 3, 28d supply, fill #2
  Filled 2022-08-28: qty 3, 28d supply, fill #3

## 2022-06-27 ENCOUNTER — Other Ambulatory Visit (HOSPITAL_COMMUNITY): Payer: Self-pay

## 2022-07-07 ENCOUNTER — Other Ambulatory Visit: Payer: Self-pay | Admitting: Family Medicine

## 2022-07-07 DIAGNOSIS — I10 Essential (primary) hypertension: Secondary | ICD-10-CM

## 2022-07-15 ENCOUNTER — Encounter: Payer: Self-pay | Admitting: Family Medicine

## 2022-07-17 ENCOUNTER — Ambulatory Visit: Payer: 59 | Admitting: Family Medicine

## 2022-07-28 ENCOUNTER — Other Ambulatory Visit (HOSPITAL_COMMUNITY): Payer: Self-pay

## 2022-07-28 ENCOUNTER — Encounter: Payer: Self-pay | Admitting: Family Medicine

## 2022-07-29 ENCOUNTER — Encounter: Payer: Self-pay | Admitting: Family Medicine

## 2022-07-29 ENCOUNTER — Ambulatory Visit: Payer: 59 | Admitting: Family Medicine

## 2022-07-29 DIAGNOSIS — R739 Hyperglycemia, unspecified: Secondary | ICD-10-CM | POA: Diagnosis not present

## 2022-07-29 DIAGNOSIS — I10 Essential (primary) hypertension: Secondary | ICD-10-CM

## 2022-07-29 DIAGNOSIS — E785 Hyperlipidemia, unspecified: Secondary | ICD-10-CM

## 2022-07-29 LAB — COMPREHENSIVE METABOLIC PANEL
ALT: 46 U/L (ref 0–53)
AST: 25 U/L (ref 0–37)
Albumin: 4.9 g/dL (ref 3.5–5.2)
Alkaline Phosphatase: 41 U/L (ref 39–117)
BUN: 12 mg/dL (ref 6–23)
CO2: 31 mEq/L (ref 19–32)
Calcium: 9.9 mg/dL (ref 8.4–10.5)
Chloride: 102 mEq/L (ref 96–112)
Creatinine, Ser: 1.23 mg/dL (ref 0.40–1.50)
GFR: 74.8 mL/min (ref >60.00)
Glucose, Bld: 84 mg/dL (ref 70–99)
Potassium: 4.4 mEq/L (ref 3.5–5.1)
Sodium: 140 mEq/L (ref 135–145)
Total Bilirubin: 0.9 mg/dL (ref 0.2–1.2)
Total Protein: 7.4 g/dL (ref 6.0–8.3)

## 2022-07-29 LAB — CBC WITH DIFFERENTIAL/PLATELET
Basophils Absolute: 0.1 10*3/uL (ref 0.0–0.1)
Basophils Relative: 0.9 % (ref 0.0–3.0)
Eosinophils Absolute: 0.1 10*3/uL (ref 0.0–0.7)
Eosinophils Relative: 1.9 % (ref 0.0–5.0)
HCT: 41.8 % (ref 39.0–52.0)
Hemoglobin: 14.1 g/dL (ref 13.0–17.0)
Lymphocytes Relative: 27.5 % (ref 12.0–46.0)
Lymphs Abs: 1.9 10*3/uL (ref 0.7–4.0)
MCHC: 33.8 g/dL (ref 30.0–36.0)
MCV: 88.9 fl (ref 78.0–100.0)
Monocytes Absolute: 0.6 10*3/uL (ref 0.1–1.0)
Monocytes Relative: 8.1 % (ref 3.0–12.0)
Neutro Abs: 4.3 10*3/uL (ref 1.4–7.7)
Neutrophils Relative %: 61.6 % (ref 43.0–77.0)
Platelets: 235 10*3/uL (ref 150.0–400.0)
RBC: 4.7 Mil/uL (ref 4.22–5.81)
RDW: 12.9 % (ref 11.5–15.5)
WBC: 7 10*3/uL (ref 4.0–10.5)

## 2022-07-29 LAB — LIPID PANEL
Cholesterol: 100 mg/dL (ref 0–200)
HDL: 33.7 mg/dL — ABNORMAL LOW (ref >39.00)
LDL Cholesterol: 53 mg/dL (ref 0–99)
NonHDL: 66.72
Total CHOL/HDL Ratio: 3
Triglycerides: 71 mg/dL (ref 0.0–149.0)
VLDL: 14.2 mg/dL (ref 0.0–40.0)

## 2022-07-29 LAB — HEMOGLOBIN A1C: Hgb A1c MFr Bld: 5.7 % (ref 4.6–6.5)

## 2022-07-29 NOTE — Assessment & Plan Note (Signed)
Check labs  con't with diet

## 2022-07-29 NOTE — Assessment & Plan Note (Signed)
con't with weight loss con't wegovy

## 2022-07-29 NOTE — Patient Instructions (Signed)

## 2022-07-29 NOTE — Progress Notes (Signed)
Subjective:   By signing my name below, I, Maurice Jimenez, attest that this documentation has been prepared under the direction and in the presence of Maurice Held, DO. 07/29/2022     Patient ID: Maurice Jimenez, male    DOB: 07/26/1986, 36 y.o.   MRN: [id number]  Chief Complaint  Patient presents with   Hyperlipidemia   Hypertension   Weight Check   Follow-up    Hyperlipidemia Pertinent negatives include no chest pain or shortness of breath.  Hypertension Pertinent negatives include no blurred vision, chest pain, headaches, malaise/fatigue, palpitations or shortness of breath.   Patient is in today for a follow up visit.   He continues trying to lose weight and has lost weight since his last in person visit. He continues maintaining a healthy diet. He exercises occasionally. He continues taking Wegovy injections regularly and reports no new issues while taking it. He weighs 273 lb's and has a BMI of 38.13 kg/m^2 Wt Readings from Last 3 Encounters:  07/29/22 273 lb 6.4 oz (124 kg)  05/06/22 282 lb 3.2 oz (128 kg)  02/04/22 288 lb (130.6 kg)   He continues experiencing daily allergies. He is taking allegra to manage his symptoms when they worsen.    Past Medical History:  Diagnosis Date   GERD (gastroesophageal reflux disease)    History of chicken pox    Ingrown toenail     Past Surgical History:  Procedure Laterality Date   IRRIGATION AND DEBRIDEMENT SEBACEOUS CYST     l upper arm   NO PAST SURGERIES      Family History  Problem Relation Age of Onset   Diabetes Other        Paternal side   Healthy Mother        Living   Healthy Father        Living   Diabetes Brother        x1   Healthy Sister        x2   Healthy Son        x1   Healthy Daughter        x1    Social History   Socioeconomic History   Marital status: Married    Spouse name: brooke    Number of children: Not on file   Years of education: Not on file   Highest education  level: Not on file  Occupational History   Occupation: help desk    Employer: [employer]  Tobacco Use   Smoking status: Never   Smokeless tobacco: Never  Vaping Use   Vaping Use: Never used  Substance and Sexual Activity   Alcohol use: Yes    Alcohol/week: 0.0 standard drinks of alcohol    Comment: rare   Drug use: No   Sexual activity: Yes    Partners: Female  Other Topics Concern   Not on file  Social History Narrative   Not on file   Social Determinants of Health   Financial Resource Strain: Not on file  Food Insecurity: Not on file  Transportation Needs: Not on file  Physical Activity: Not on file  Stress: Not on file  Social Connections: Not on file  Intimate Partner Violence: Not on file    Outpatient Medications Prior to Visit  Medication Sig Dispense Refill   cyclobenzaprine (FLEXERIL) 10 MG tablet Take 1 tablet (10 mg total) by mouth 3 (three) times daily as needed for muscle spasms. 30 tablet 0   fenofibrate 160  MG tablet TAKE 1 TABLET BY MOUTH EVERY DAY 90 tablet 1   losartan (COZAAR) 100 MG tablet TAKE 1 TABLET BY MOUTH EVERY DAY 90 tablet 1   rosuvastatin (CRESTOR) 10 MG tablet TAKE 1 TABLET BY MOUTH EVERYDAY AT BEDTIME 90 tablet 1   Semaglutide-Weight Management (WEGOVY) 2.4 MG/0.75ML SOAJ Inject 2.4 mg into the skin once a week. 3 mL 3   aspirin 325 MG tablet Take 325 mg by mouth daily as needed for mild pain or moderate pain.     No facility-administered medications prior to visit.    No Known Allergies  Review of Systems  Constitutional:  Negative for fever and malaise/fatigue.  HENT:  Negative for congestion.   Eyes:  Negative for blurred vision.  Respiratory:  Negative for shortness of breath.   Cardiovascular:  Negative for chest pain, palpitations and leg swelling.  Gastrointestinal:  Negative for abdominal pain, blood in stool and nausea.  Genitourinary:  Negative for dysuria and frequency.  Musculoskeletal:  Negative for falls.  Skin:   Negative for rash.  Neurological:  Negative for dizziness, loss of consciousness and headaches.  Endo/Heme/Allergies:  Negative for environmental allergies.  Psychiatric/Behavioral:  Negative for depression. The patient is not nervous/anxious.        Objective:    Physical Exam Vitals and nursing note reviewed.  Constitutional:      General: He is not in acute distress.    Appearance: Normal appearance. He is not ill-appearing.  HENT:     Head: Normocephalic and atraumatic.     Right Ear: External ear normal.     Left Ear: External ear normal.  Eyes:     Extraocular Movements: Extraocular movements intact.     Pupils: Pupils are equal, round, and reactive to light.  Cardiovascular:     Rate and Rhythm: Normal rate and regular rhythm.     Heart sounds: Normal heart sounds. No murmur heard.    No gallop.  Pulmonary:     Effort: Pulmonary effort is normal. No respiratory distress.     Breath sounds: Normal breath sounds. No wheezing or rales.  Skin:    General: Skin is warm and dry.  Neurological:     Mental Status: He is alert and oriented to person, place, and time.  Psychiatric:        Judgment: Judgment normal.     BP 110/80 (BP Location: Left Arm, Patient Position: Sitting, Cuff Size: Large)   Pulse 74   Temp 98.6 F (37 C) (Oral)   Resp 16   Ht '5\' 11"'$  (1.803 m)   Wt 273 lb 6.4 oz (124 kg)   SpO2 97%   BMI 38.13 kg/m  Wt Readings from Last 3 Encounters:  07/29/22 273 lb 6.4 oz (124 kg)  05/06/22 282 lb 3.2 oz (128 kg)  02/04/22 288 lb (130.6 kg)    Diabetic Foot Exam - Simple   No data filed    Lab Results  Component Value Date   WBC 6.6 01/02/2022   HGB 14.4 01/02/2022   HCT 42.8 01/02/2022   PLT 218.0 01/02/2022   GLUCOSE 86 01/02/2022   CHOL 115 01/02/2022   TRIG 100.0 01/02/2022   HDL 36.30 (L) 01/02/2022   LDLDIRECT 123.0 04/24/2020   LDLCALC 59 01/02/2022   ALT 68 (H) 01/02/2022   AST 34 01/02/2022   NA 142 01/02/2022   K 4.4 01/02/2022    CL 105 01/02/2022   CREATININE 1.17 01/02/2022   BUN 13 01/02/2022  CO2 31 01/02/2022   TSH 0.97 01/19/2020   HGBA1C 5.9 01/02/2022    Lab Results  Component Value Date   TSH 0.97 01/19/2020   Lab Results  Component Value Date   WBC 6.6 01/02/2022   HGB 14.4 01/02/2022   HCT 42.8 01/02/2022   MCV 89.5 01/02/2022   PLT 218.0 01/02/2022   Lab Results  Component Value Date   NA 142 01/02/2022   K 4.4 01/02/2022   CO2 31 01/02/2022   GLUCOSE 86 01/02/2022   BUN 13 01/02/2022   CREATININE 1.17 01/02/2022   BILITOT 0.8 01/02/2022   ALKPHOS 41 01/02/2022   AST 34 01/02/2022   ALT 68 (H) 01/02/2022   PROT 7.0 01/02/2022   ALBUMIN 4.8 01/02/2022   CALCIUM 9.7 01/02/2022   ANIONGAP 12 11/22/2014   GFR 79.74 01/02/2022   Lab Results  Component Value Date   CHOL 115 01/02/2022   Lab Results  Component Value Date   HDL 36.30 (L) 01/02/2022   Lab Results  Component Value Date   LDLCALC 59 01/02/2022   Lab Results  Component Value Date   TRIG 100.0 01/02/2022   Lab Results  Component Value Date   CHOLHDL 3 01/02/2022   Lab Results  Component Value Date   HGBA1C 5.9 01/02/2022       Assessment & Plan:   Problem List Items Addressed This Visit       Unprioritized   Primary hypertension    Well controlled, no changes to meds. Encouraged heart healthy diet such as the DASH diet and exercise as tolerated.       Morbid obesity ([place]) - Primary    con't with weight loss con't wegovy      Relevant Orders   CBC with Differential/Platelet   Comprehensive metabolic panel   Lipid panel   Hemoglobin A1c   Insulin, random   Hyperlipidemia    Tolerating statin, encouraged heart healthy diet, avoid trans fats, minimize simple carbs and saturated fats. Increase exercise as tolerated      Relevant Orders   CBC with Differential/Platelet   Lipid panel   Hyperglycemia    Check labs  con't with diet       Relevant Orders   Comprehensive metabolic panel    Lipid panel   Hemoglobin A1c     No orders of the defined types were placed in this encounter.   IAnn Held, DO, personally preformed the services described in this documentation.  All medical record entries made by the scribe were at my direction and in my presence.  I have reviewed the chart and discharge instructions (if applicable) and agree that the record reflects my personal performance and is accurate and complete. 07/29/2022   I,Maurice Jimenez,acting as a scribe for Maurice Held, DO.,have documented all relevant documentation on the behalf of Maurice Held, DO,as directed by  Maurice Held, DO while in the presence of Maurice Held, DO.   Maurice Held, DO

## 2022-07-29 NOTE — Assessment & Plan Note (Signed)
Tolerating statin, encouraged heart healthy diet, avoid trans fats, minimize simple carbs and saturated fats. Increase exercise as tolerated 

## 2022-07-29 NOTE — Assessment & Plan Note (Signed)
Well controlled, no changes to meds. Encouraged heart healthy diet such as the DASH diet and exercise as tolerated.  °

## 2022-07-30 LAB — INSULIN, RANDOM: Insulin: 14.8 u[IU]/mL

## 2022-09-02 ENCOUNTER — Other Ambulatory Visit (HOSPITAL_COMMUNITY): Payer: Self-pay

## 2022-09-05 ENCOUNTER — Other Ambulatory Visit (HOSPITAL_COMMUNITY): Payer: Self-pay

## 2022-09-08 ENCOUNTER — Telehealth: Payer: Self-pay

## 2022-09-08 ENCOUNTER — Encounter: Payer: Self-pay | Admitting: Family Medicine

## 2022-09-08 NOTE — Telephone Encounter (Signed)
Key: [id number]  Sent to plan

## 2022-09-09 ENCOUNTER — Other Ambulatory Visit: Payer: Self-pay

## 2022-09-09 ENCOUNTER — Other Ambulatory Visit (HOSPITAL_COMMUNITY): Payer: Self-pay

## 2022-09-09 NOTE — Telephone Encounter (Signed)
PA approved.

## 2022-09-12 ENCOUNTER — Other Ambulatory Visit (HOSPITAL_COMMUNITY): Payer: Self-pay

## 2022-10-05 ENCOUNTER — Telehealth: Payer: 59 | Admitting: Nurse Practitioner

## 2022-10-05 DIAGNOSIS — B9689 Other specified bacterial agents as the cause of diseases classified elsewhere: Secondary | ICD-10-CM | POA: Diagnosis not present

## 2022-10-05 DIAGNOSIS — J019 Acute sinusitis, unspecified: Secondary | ICD-10-CM | POA: Diagnosis not present

## 2022-10-05 MED ORDER — AMOXICILLIN-POT CLAVULANATE 875-125 MG PO TABS: 1.0000 | ORAL_TABLET | Freq: Two times a day (BID) | ORAL | 0 refills | Status: AC

## 2022-10-05 NOTE — Patient Instructions (Signed)
  [name], thank you for joining [name], NP for today's virtual visit.  While this provider is not your primary care provider (PCP), if your PCP is located in our provider database this encounter information will be shared with them immediately following your visit.   [facility] account gives you access to today's visit and all your visits, tests, and labs performed at [facility] " click here if you don't have a [facility] account or go to [email or web address]  Consent: (Patient) Maurice Jimenez provided verbal consent for this virtual visit at the beginning of the encounter.  Current Medications:  Current Outpatient Medications:    amoxicillin-clavulanate (AUGMENTIN) 875-125 MG tablet, Take 1 tablet by mouth 2 (two) times daily for 7 days., Disp: 14 tablet, Rfl: 0   cyclobenzaprine (FLEXERIL) 10 MG tablet, Take 1 tablet (10 mg total) by mouth 3 (three) times daily as needed for muscle spasms., Disp: 30 tablet, Rfl: 0   fenofibrate 160 MG tablet, TAKE 1 TABLET BY MOUTH EVERY DAY, Disp: 90 tablet, Rfl: 1   losartan (COZAAR) 100 MG tablet, TAKE 1 TABLET BY MOUTH EVERY DAY, Disp: 90 tablet, Rfl: 1   rosuvastatin (CRESTOR) 10 MG tablet, TAKE 1 TABLET BY MOUTH EVERYDAY AT BEDTIME, Disp: 90 tablet, Rfl: 1   Semaglutide-Weight Management (WEGOVY) 2.4 MG/0.75ML SOAJ, Inject 2.4 mg into the skin once a week., Disp: 3 mL, Rfl: 3   Medications ordered in this encounter:  Meds ordered this encounter  Medications   amoxicillin-clavulanate (AUGMENTIN) 875-125 MG tablet    Sig: Take 1 tablet by mouth 2 (two) times daily for 7 days.    Dispense:  14 tablet    Refill:  0    Order Specific Question:   Supervising Provider    Answer:   Chase Picket [id number]     *If you need refills on other medications prior to your next appointment, please contact your pharmacy*  Follow-Up: Call back or seek an in-person evaluation if the symptoms worsen or if  the condition fails to improve as anticipated.  [name] [phone]  Other Instructions INSTRUCTIONS: use a humidifier for nasal congestion Drink plenty of fluids, rest and wash hands frequently to avoid the spread of infection Alternate tylenol and Motrin for relief of fever    If you have been instructed to have an in-person evaluation today at a local Urgent Care facility, please use the link below. It will take you to a list of all of our available [facility], including address, phone number and hours of operation. Please do not delay care.  [facility]  If you or a family member do not have a primary care provider, use the link below to schedule a visit and establish care. When you choose a [facility] primary care physician or advanced practice provider, you gain a long-term partner in health. Find a Primary Care Provider  Learn more about [facility]'s in-office and virtual care options: [facility]

## 2022-10-05 NOTE — Progress Notes (Signed)
Virtual Visit Consent   Maurice Jimenez, you are scheduled for a virtual visit with a [facility] provider today. Just as with appointments in the office, your consent must be obtained to participate. Your consent will be active for this visit and any virtual visit you may have with one of our providers in the next 365 days. If you have a MyChart account, a copy of this consent can be sent to you electronically.  As this is a virtual visit, video technology does not allow for your provider to perform a traditional examination. This may limit your provider's ability to fully assess your condition. If your provider identifies any concerns that need to be evaluated in person or the need to arrange testing (such as labs, EKG, etc.), we will make arrangements to do so. Although advances in technology are sophisticated, we cannot ensure that it will always work on either your end or our end. If the connection with a video visit is poor, the visit may have to be switched to a telephone visit. With either a video or telephone visit, we are not always able to ensure that we have a secure connection.  By engaging in this virtual visit, you consent to the provision of healthcare and authorize for your insurance to be billed (if applicable) for the services provided during this visit. Depending on your insurance coverage, you may receive a charge related to this service.  I need to obtain your verbal consent now. Are you willing to proceed with your visit today? Sherrell Farish has provided verbal consent on 10/05/2022 for a virtual visit (video or telephone). Maurice Pounds, NP  Date: 10/05/2022 5:30 PM  Virtual Visit via Video Note   I, Maurice Jimenez, connected with  Maurice Jimenez  ([id number], [birth year]) on 10/05/22 at  5:30 PM EST by a video-enabled telemedicine application and verified that I am speaking with the correct person using two identifiers.  Location: Patient: Virtual Visit Location Patient:  Home Provider: Virtual Visit Location Provider: Home Office   I discussed the limitations of evaluation and management by telemedicine and the availability of in person appointments. The patient expressed understanding and agreed to proceed.    History of Present Illness: Maurice Jimenez is a 36 y.o. who identifies as a male who was assigned male at birth, and is being seen today for bacterial sinusitis.  Sinus Pain: Patient complains of 1 week onset of nasal congestion, pain and pressure behind his eyes, hoarseness, sore throat, persistent cough and purulent nasal drainage streaked with blood. Clinical course unchanged since onset.  Past history is significant for no history of pneumonia or bronchitis. Patient is non-smoker    Problems:  Patient Active Problem List   Diagnosis Date Noted   Morbid obesity ([place]) 01/02/2022   Hyperglycemia 04/29/2020   Hyperlipidemia 04/29/2020   Chronic bilateral low back pain without sciatica 04/29/2020   Acute left-sided low back pain without sciatica 01/19/2020   Mass of soft tissue of chest 01/19/2020   Primary hypertension 01/19/2020   Abnormal EKG 12/25/2014   LVH (left ventricular hypertrophy) 12/25/2014   Anxiety state 11/27/2014    Allergies: No Known Allergies Medications:  Current Outpatient Medications:    amoxicillin-clavulanate (AUGMENTIN) 875-125 MG tablet, Take 1 tablet by mouth 2 (two) times daily for 7 days., Disp: 14 tablet, Rfl: 0   cyclobenzaprine (FLEXERIL) 10 MG tablet, Take 1 tablet (10 mg total) by mouth 3 (three) times daily as needed for muscle spasms., Disp: 30 tablet, Rfl:  0   fenofibrate 160 MG tablet, TAKE 1 TABLET BY MOUTH EVERY DAY, Disp: 90 tablet, Rfl: 1   losartan (COZAAR) 100 MG tablet, TAKE 1 TABLET BY MOUTH EVERY DAY, Disp: 90 tablet, Rfl: 1   rosuvastatin (CRESTOR) 10 MG tablet, TAKE 1 TABLET BY MOUTH EVERYDAY AT BEDTIME, Disp: 90 tablet, Rfl: 1   Semaglutide-Weight Management (WEGOVY) 2.4 MG/0.75ML SOAJ, Inject  2.4 mg into the skin once a week., Disp: 3 mL, Rfl: 3  Observations/Objective: Patient is well-developed, well-nourished in no acute distress.  Resting comfortably at home.  Head is normocephalic, atraumatic.  No labored breathing.  Speech is clear and coherent with logical content.  Patient is alert and oriented at baseline.    Assessment and Plan: 1. Acute bacterial sinusitis - amoxicillin-clavulanate (AUGMENTIN) 875-125 MG tablet; Take 1 tablet by mouth 2 (two) times daily for 7 days.  Dispense: 14 tablet; Refill: 0  INSTRUCTIONS: use a humidifier for nasal congestion Drink plenty of fluids, rest and wash hands frequently to avoid the spread of infection Alternate tylenol and Motrin for relief of fever   Follow Up Instructions: I discussed the assessment and treatment plan with the patient. The patient was provided an opportunity to ask questions and all were answered. The patient agreed with the plan and demonstrated an understanding of the instructions.  A copy of instructions were sent to the patient via MyChart unless otherwise noted below.    The patient was advised to call back or seek an in-person evaluation if the symptoms worsen or if the condition fails to improve as anticipated.  Time:  I spent 11 minutes with the patient via telehealth technology discussing the above problems/concerns.    Maurice Pounds, NP

## 2022-10-07 ENCOUNTER — Other Ambulatory Visit: Payer: Self-pay | Admitting: Family Medicine

## 2022-10-08 ENCOUNTER — Other Ambulatory Visit (HOSPITAL_COMMUNITY): Payer: Self-pay

## 2022-10-08 MED ORDER — WEGOVY 2.4 MG/0.75ML ~~LOC~~ SOAJ
2.4000 mg | SUBCUTANEOUS | 3 refills | Status: DC
  Filled 2022-10-08: qty 3, 28d supply, fill #0

## 2022-10-12 ENCOUNTER — Other Ambulatory Visit: Payer: Self-pay | Admitting: Family Medicine

## 2022-10-18 ENCOUNTER — Encounter: Payer: Self-pay | Admitting: Family Medicine

## 2022-10-27 ENCOUNTER — Encounter: Payer: Self-pay | Admitting: Family Medicine

## 2022-10-29 ENCOUNTER — Other Ambulatory Visit (HOSPITAL_COMMUNITY): Payer: Self-pay

## 2022-10-29 ENCOUNTER — Other Ambulatory Visit (HOSPITAL_BASED_OUTPATIENT_CLINIC_OR_DEPARTMENT_OTHER): Payer: Self-pay

## 2022-10-29 MED ORDER — WEGOVY 1.7 MG/0.75ML ~~LOC~~ SOAJ: 1.7000 mg | SUBCUTANEOUS | 3 refills | Status: DC

## 2022-10-29 MED ORDER — WEGOVY 1.7 MG/0.75ML ~~LOC~~ SOAJ
1.7000 mg | SUBCUTANEOUS | 3 refills | Status: DC
  Filled 2022-10-29 (×2): qty 3, 28d supply, fill #0
  Filled 2022-11-25: qty 3, 28d supply, fill #1
  Filled 2022-12-21: qty 3, 28d supply, fill #2
  Filled 2023-01-30: qty 3, 28d supply, fill #3

## 2022-10-29 NOTE — Addendum Note (Signed)
Addended by: Sanda Linger on: 10/29/2022 09:49 AM   Modules accepted: Orders

## 2022-11-21 ENCOUNTER — Other Ambulatory Visit: Payer: Self-pay

## 2022-11-27 ENCOUNTER — Ambulatory Visit: Payer: 59 | Admitting: Family Medicine

## 2022-11-27 ENCOUNTER — Encounter: Payer: Self-pay | Admitting: Family Medicine

## 2022-11-27 VITALS — BP 120/84 | HR 73 | Temp 98.7°F | Resp 18 | Ht 71.0 in | Wt 262.2 lb

## 2022-11-27 DIAGNOSIS — R591 Generalized enlarged lymph nodes: Secondary | ICD-10-CM | POA: Diagnosis not present

## 2022-11-27 LAB — COMPREHENSIVE METABOLIC PANEL
ALT: 37 U/L (ref 0–53)
AST: 23 U/L (ref 0–37)
Albumin: 4.8 g/dL (ref 3.5–5.2)
Alkaline Phosphatase: 43 U/L (ref 39–117)
BUN: 14 mg/dL (ref 6–23)
CO2: 31 mEq/L (ref 19–32)
Calcium: 10.2 mg/dL (ref 8.4–10.5)
Chloride: 102 mEq/L (ref 96–112)
Creatinine, Ser: 1.3 mg/dL (ref 0.40–1.50)
GFR: 69.83 mL/min (ref >60.00)
Glucose, Bld: 86 mg/dL (ref 70–99)
Potassium: 4.5 mEq/L (ref 3.5–5.1)
Sodium: 142 mEq/L (ref 135–145)
Total Bilirubin: 0.7 mg/dL (ref 0.2–1.2)
Total Protein: 7.7 g/dL (ref 6.0–8.3)

## 2022-11-27 LAB — CBC WITH DIFFERENTIAL/PLATELET
Basophils Absolute: 0 10*3/uL (ref 0.0–0.1)
Basophils Relative: 0.5 % (ref 0.0–3.0)
Eosinophils Absolute: 0.2 10*3/uL (ref 0.0–0.7)
Eosinophils Relative: 3.3 % (ref 0.0–5.0)
HCT: 42.3 % (ref 39.0–52.0)
Hemoglobin: 14.6 g/dL (ref 13.0–17.0)
Lymphocytes Relative: 30.8 % (ref 12.0–46.0)
Lymphs Abs: 2.1 10*3/uL (ref 0.7–4.0)
MCHC: 34.4 g/dL (ref 30.0–36.0)
MCV: 88.4 fl (ref 78.0–100.0)
Monocytes Absolute: 0.5 10*3/uL (ref 0.1–1.0)
Monocytes Relative: 6.8 % (ref 3.0–12.0)
Neutro Abs: 4 10*3/uL (ref 1.4–7.7)
Neutrophils Relative %: 58.6 % (ref 43.0–77.0)
Platelets: 264 10*3/uL (ref 150.0–400.0)
RBC: 4.79 Mil/uL (ref 4.22–5.81)
RDW: 12.9 % (ref 11.5–15.5)
WBC: 6.9 10*3/uL (ref 4.0–10.5)

## 2022-11-27 LAB — MONONUCLEOSIS SCREEN: Mono Screen: NEGATIVE

## 2022-11-27 MED ORDER — AMOXICILLIN 875 MG PO TABS: 875.0000 mg | ORAL_TABLET | Freq: Two times a day (BID) | ORAL | 0 refills | Status: AC

## 2022-11-27 NOTE — Patient Instructions (Signed)
Lymphadenopathy  Lymphadenopathy means that your lymph glands are swollen or larger than normal. Lymph glands, also called lymph nodes, are collections of tissue that filter excess fluid, bacteria, viruses, and waste from your bloodstream. They are part of your body's disease-fighting system (immune system), which protects your body from germs. There may be different causes of lymphadenopathy, depending on where it is in your body. Some types go away on their own. Lymphadenopathy can occur anywhere that you have lymph glands, including these areas: Neck (cervical lymphadenopathy). Chest (mediastinal lymphadenopathy). Lungs (hilar lymphadenopathy). Underarms (axillary lymphadenopathy). Groin (inguinal lymphadenopathy). When your immune system responds to germs, infection-fighting cells and fluid build up in your lymph glands. This causes some swelling and enlargement. If the lymph nodes do not go back to normal size after you have an infection or disease, your health care provider may do tests. These tests help to monitor your condition and find the reason why the glands are still swollen and enlarged. Follow these instructions at home:  Get plenty of rest. Your health care provider may recommend over-the-counter medicines for pain. Take over-the-counter and prescription medicines only as told by your health care provider. If directed, apply heat to swollen lymph glands as often as told by your health care provider. Use the heat source that your health care provider recommends, such as a moist heat pack or a heating pad. Place a towel between your skin and the heat source. Leave the heat on for 20-30 minutes. Remove the heat if your skin turns bright red. This is especially important if you are unable to feel pain, heat, or cold. You may have a greater risk of getting burned. Check your affected lymph glands every day for changes. Check other lymph gland areas as told by your health care provider.  Check for changes such as: More swelling. Sudden increase in size. Redness or pain. Hardness. Keep all follow-up visits. This is important. Contact a health care provider if you have: Lymph glands that: Are still swollen after 2 weeks. Have suddenly gotten bigger or the swelling spreads. Are red, painful, or hard. Fluid leaking from the skin near an enlarged lymph gland. Problems with breathing. A fever, chills, or night sweats. Fatigue. A sore throat. Pain in your abdomen. Weight loss. Get help right away if you have: Severe pain. Chest pain. Shortness of breath. These symptoms may represent a serious problem that is an emergency. Do not wait to see if the symptoms will go away. Get medical help right away. Call your local emergency services (911 in the [place] Do not drive yourself to the hospital. Summary Lymphadenopathy means that your lymph glands are swollen or larger than normal. Lymph glands, also called lymph nodes, are collections of tissue that filter excess fluid, bacteria, viruses, and waste from the bloodstream. They are part of your body's disease-fighting system (immune system). Lymphadenopathy can occur anywhere that you have lymph glands. If the lymph nodes do not go back to normal size after you have an infection or disease, your health care provider may do tests to monitor your condition and find the reason why the glands are still swollen and enlarged. Check your affected lymph glands every day for changes. Check other lymph gland areas as told by your health care provider. This information is not intended to replace advice given to you by your health care provider. Make sure you discuss any questions you have with your health care provider. Document Revised: 07/25/2020 Document Reviewed: 07/25/2020 Elsevier Patient Education  2023 Elsevier Inc.  

## 2022-11-27 NOTE — Progress Notes (Signed)
Subjective:   By signing my name below, I, [name], attest that this documentation has been prepared under the direction and in the presence of [name], DO. 11/27/2022   Patient ID: Maurice Jimenez, male    DOB: 1986-02-28, 37 y.o.   MRN: [id number]  Chief Complaint  Patient presents with   Sore Throat    Pt states having gland pain. Occ pain after sneezing or yawning.     Sore Throat  Pertinent negatives include no abdominal pain, congestion, coughing, diarrhea, headaches, shortness of breath or vomiting.   Patient is in today for a office visit.   He complains of sore throat. He has on and off congestion and cough since 10/06/2022. He had a fever of 102.2 degrees F last week but does not have a fever at this time. Her denies having any sputum production with his cough. He is on OTC Day time Cold and flu medication.    Past Medical History:  Diagnosis Date   GERD (gastroesophageal reflux disease)    History of chicken pox    Ingrown toenail     Past Surgical History:  Procedure Laterality Date   IRRIGATION AND DEBRIDEMENT SEBACEOUS CYST     l upper arm   NO PAST SURGERIES      Family History  Problem Relation Age of Onset   Diabetes Other        Paternal side   Healthy Mother        Living   Healthy Father        Living   Diabetes Brother        x1   Healthy Sister        x2   Healthy Son        x1   Healthy Daughter        x1    Social History   Socioeconomic History   Marital status: Married    Spouse name: brooke    Number of children: Not on file   Years of education: Not on file   Highest education level: Not on file  Occupational History   Occupation: help desk    Employer: [employer]  Tobacco Use   Smoking status: Never   Smokeless tobacco: Never  Vaping Use   Vaping Use: Never used  Substance and Sexual Activity   Alcohol use: Yes    Alcohol/week: 0.0 standard drinks of alcohol    Comment: rare   Drug use: No    Sexual activity: Yes    Partners: Female  Other Topics Concern   Not on file  Social History Narrative   Not on file   Social Determinants of Health   Financial Resource Strain: Not on file  Food Insecurity: Not on file  Transportation Needs: Not on file  Physical Activity: Not on file  Stress: Not on file  Social Connections: Not on file  Intimate Partner Violence: Not on file    Outpatient Medications Prior to Visit  Medication Sig Dispense Refill   cyclobenzaprine (FLEXERIL) 10 MG tablet Take 1 tablet (10 mg total) by mouth 3 (three) times daily as needed for muscle spasms. 30 tablet 0   fenofibrate 160 MG tablet TAKE 1 TABLET BY MOUTH EVERY DAY 90 tablet 1   losartan (COZAAR) 100 MG tablet TAKE 1 TABLET BY MOUTH EVERY DAY 90 tablet 1   rosuvastatin (CRESTOR) 10 MG tablet TAKE 1 TABLET BY MOUTH EVERYDAY AT BEDTIME 90 tablet 1   Semaglutide-Weight  Management (WEGOVY) 1.7 MG/0.75ML SOAJ Inject 1.7 mg into the skin once a week. 3 mL 3   No facility-administered medications prior to visit.    No Known Allergies  Review of Systems  Constitutional:  Negative for chills, fever and malaise/fatigue.  HENT:  Positive for sore throat. Negative for congestion and hearing loss.   Eyes:  Negative for blurred vision and discharge.  Respiratory:  Negative for cough, sputum production and shortness of breath.   Cardiovascular:  Negative for chest pain, palpitations and leg swelling.  Gastrointestinal:  Negative for abdominal pain, blood in stool, constipation, diarrhea, heartburn, nausea and vomiting.  Genitourinary:  Negative for dysuria, frequency, hematuria and urgency.  Musculoskeletal:  Negative for back pain, falls and myalgias.  Skin:  Negative for rash.  Neurological:  Negative for dizziness, sensory change, loss of consciousness, weakness and headaches.  Endo/Heme/Allergies:  Negative for environmental allergies. Does not bruise/bleed easily.  Psychiatric/Behavioral:  Negative  for depression and suicidal ideas. The patient is not nervous/anxious and does not have insomnia.        Objective:    Physical Exam Vitals and nursing note reviewed.  Constitutional:      General: He is not in acute distress.    Appearance: Normal appearance. He is not ill-appearing.  HENT:     Head: Normocephalic and atraumatic.     Right Ear: External ear normal.     Left Ear: External ear normal.     Mouth/Throat:     Mouth: Mucous membranes are moist.     Pharynx: Oropharynx is clear.  Eyes:     Extraocular Movements: Extraocular movements intact.     Pupils: Pupils are equal, round, and reactive to light.  Cardiovascular:     Rate and Rhythm: Normal rate and regular rhythm.     Heart sounds: Normal heart sounds. No murmur heard.    No gallop.  Pulmonary:     Effort: Pulmonary effort is normal. No respiratory distress.     Breath sounds: Normal breath sounds. No wheezing or rales.  Lymphadenopathy:     Cervical: Cervical adenopathy present.  Skin:    General: Skin is warm and dry.  Neurological:     Mental Status: He is alert and oriented to person, place, and time.  Psychiatric:        Judgment: Judgment normal.     BP 120/84 (BP Location: Left Arm, Patient Position: Sitting, Cuff Size: Normal)   Pulse 73   Temp 98.7 F (37.1 C) (Oral)   Resp 18   Ht 5' 11"$  (1.803 m)   Wt 262 lb 3.2 oz (118.9 kg)   SpO2 98%   BMI 36.57 kg/m  Wt Readings from Last 3 Encounters:  11/27/22 262 lb 3.2 oz (118.9 kg)  07/29/22 273 lb 6.4 oz (124 kg)  05/06/22 282 lb 3.2 oz (128 kg)       Assessment & Plan:  Lymphadenopathy Assessment & Plan: Abx per orders  Check labs  Return to office if symptoms do not improve or worsen   Orders: -     CBC with Differential/Platelet -     Comprehensive metabolic panel -     Mononucleosis screen -     Epstein-Barr virus VCA antibody panel -     Amoxicillin; Take 1 tablet (875 mg total) by mouth 2 (two) times daily for 10 days.   Dispense: 20 tablet; Refill: 0    I, [name], DO, personally preformed the services described  in this documentation.  All medical record entries made by the scribe were at my direction and in my presence.  I have reviewed the chart and discharge instructions (if applicable) and agree that the record reflects my personal performance and is accurate and complete. 11/27/2022   I,[name],acting as a scribe for [name], DO.,have documented all relevant documentation on the behalf of [name], DO,as directed by  [name], DO while in the presence of [name], DO.   [name], DO

## 2022-11-27 NOTE — Assessment & Plan Note (Signed)
Abx per orders  Check labs  Return to office if symptoms do not improve or worsen

## 2022-11-28 LAB — EPSTEIN-BARR VIRUS VCA ANTIBODY PANEL
EBV NA IgG: 151 U/mL — ABNORMAL HIGH
EBV VCA IgG: 414 U/mL — ABNORMAL HIGH
EBV VCA IgM: <36 U/mL

## 2022-12-01 ENCOUNTER — Ambulatory Visit: Payer: 59 | Admitting: Family Medicine

## 2022-12-03 ENCOUNTER — Encounter: Payer: Self-pay | Admitting: Family Medicine

## 2022-12-26 ENCOUNTER — Other Ambulatory Visit: Payer: Self-pay

## 2023-01-12 ENCOUNTER — Other Ambulatory Visit: Payer: Self-pay | Admitting: Family Medicine

## 2023-01-12 DIAGNOSIS — I10 Essential (primary) hypertension: Secondary | ICD-10-CM

## 2023-01-29 ENCOUNTER — Other Ambulatory Visit (HOSPITAL_COMMUNITY): Payer: Self-pay

## 2023-01-29 ENCOUNTER — Ambulatory Visit: Payer: 59 | Admitting: Family Medicine

## 2023-01-29 DIAGNOSIS — I10 Essential (primary) hypertension: Secondary | ICD-10-CM

## 2023-01-29 DIAGNOSIS — E785 Hyperlipidemia, unspecified: Secondary | ICD-10-CM

## 2023-01-29 LAB — CBC WITH DIFFERENTIAL/PLATELET
Basophils Absolute: 0.1 10*3/uL (ref 0.0–0.1)
Basophils Relative: 0.9 % (ref 0.0–3.0)
Eosinophils Absolute: 0.2 10*3/uL (ref 0.0–0.7)
Eosinophils Relative: 2.7 % (ref 0.0–5.0)
HCT: 43.7 % (ref 39.0–52.0)
Hemoglobin: 14.6 g/dL (ref 13.0–17.0)
Lymphocytes Relative: 32.5 % (ref 12.0–46.0)
Lymphs Abs: 2 10*3/uL (ref 0.7–4.0)
MCHC: 33.5 g/dL (ref 30.0–36.0)
MCV: 89.3 fl (ref 78.0–100.0)
Monocytes Absolute: 0.5 10*3/uL (ref 0.1–1.0)
Monocytes Relative: 7.7 % (ref 3.0–12.0)
Neutro Abs: 3.4 10*3/uL (ref 1.4–7.7)
Neutrophils Relative %: 56.2 % (ref 43.0–77.0)
Platelets: 248 10*3/uL (ref 150.0–400.0)
RBC: 4.89 Mil/uL (ref 4.22–5.81)
RDW: 12.9 % (ref 11.5–15.5)
WBC: 6 10*3/uL (ref 4.0–10.5)

## 2023-01-29 LAB — LIPID PANEL
Cholesterol: 101 mg/dL (ref 0–200)
HDL: 40.8 mg/dL (ref >39.00)
LDL Cholesterol: 49 mg/dL (ref 0–99)
NonHDL: 60.41
Total CHOL/HDL Ratio: 2
Triglycerides: 57 mg/dL (ref 0.0–149.0)
VLDL: 11.4 mg/dL (ref 0.0–40.0)

## 2023-01-29 LAB — COMPREHENSIVE METABOLIC PANEL
ALT: 34 U/L (ref 0–53)
AST: 23 U/L (ref 0–37)
Albumin: 4.7 g/dL (ref 3.5–5.2)
Alkaline Phosphatase: 40 U/L (ref 39–117)
BUN: 14 mg/dL (ref 6–23)
CO2: 30 mEq/L (ref 19–32)
Calcium: 9.7 mg/dL (ref 8.4–10.5)
Chloride: 104 mEq/L (ref 96–112)
Creatinine, Ser: 1.23 mg/dL (ref 0.40–1.50)
GFR: 74.53 mL/min (ref >60.00)
Glucose, Bld: 85 mg/dL (ref 70–99)
Potassium: 4.3 mEq/L (ref 3.5–5.1)
Sodium: 142 mEq/L (ref 135–145)
Total Bilirubin: 0.7 mg/dL (ref 0.2–1.2)
Total Protein: 7.2 g/dL (ref 6.0–8.3)

## 2023-01-29 LAB — TSH: TSH: 1.71 u[IU]/mL (ref 0.35–5.50)

## 2023-01-29 MED ORDER — WEGOVY 2.4 MG/0.75ML ~~LOC~~ SOAJ
2.4000 mg | SUBCUTANEOUS | 5 refills | Status: DC
  Filled 2023-01-29: qty 3, 28d supply, fill #0
  Filled 2023-02-23: qty 3, 28d supply, fill #1
  Filled 2023-03-23: qty 3, 28d supply, fill #2
  Filled 2023-04-20: qty 3, 28d supply, fill #3
  Filled 2023-05-19: qty 3, 28d supply, fill #4
  Filled 2023-06-14: qty 3, 28d supply, fill #5

## 2023-01-29 NOTE — Assessment & Plan Note (Signed)
Well controlled, no changes to meds. Encouraged heart healthy diet such as the DASH diet and exercise as tolerated.  °

## 2023-01-29 NOTE — Progress Notes (Signed)
Subjective:   By signing my name below, I, Maurice Jimenez, attest that this documentation has been prepared under the direction and in the presence of Maurice Schultz, DO. 01/29/2023   Patient ID: Maurice Jimenez, male    DOB: 1986-08-31, 37 y.o.   MRN: [id number]  Chief Complaint  Patient presents with   Follow-up    No concerns     HPI Patient is in today for a follow up visit.   He is interested in taking Wegovy to help assist with weight loss.  Wt Readings from Last 3 Encounters:  01/29/23 265 lb (120.2 kg)  11/27/22 262 lb 3.2 oz (118.9 kg)  07/29/22 273 lb 6.4 oz (124 kg)   Otherwise he has no new issues to report.    Past Medical History:  Diagnosis Date   GERD (gastroesophageal reflux disease)    History of chicken pox    Ingrown toenail     Past Surgical History:  Procedure Laterality Date   IRRIGATION AND DEBRIDEMENT SEBACEOUS CYST     l upper arm   NO PAST SURGERIES      Family History  Problem Relation Age of Onset   Diabetes Other        Paternal side   Healthy Mother        Living   Healthy Father        Living   Diabetes Brother        x1   Healthy Sister        x2   Healthy Son        x1   Healthy Daughter        x1    Social History   Socioeconomic History   Marital status: Married    Spouse name: Maurice Jimenez    Number of children: Not on file   Years of education: Not on file   Highest education level: Some college, no degree  Occupational History   Occupation: help desk    Employer: [employer]  Tobacco Use   Smoking status: Never   Smokeless tobacco: Never  Vaping Use   Vaping Use: Never used  Substance and Sexual Activity   Alcohol use: Yes    Alcohol/week: 0.0 standard drinks of alcohol    Comment: rare   Drug use: No   Sexual activity: Yes    Partners: Female  Other Topics Concern   Not on file  Social History Narrative   Not on file   Social Determinants of Health   Financial Resource Strain: Medium Risk  (01/29/2023)   Overall Financial Resource Strain (CARDIA)    Difficulty of Paying Living Expenses: Somewhat hard  Food Insecurity: No Food Insecurity (01/29/2023)   Hunger Vital Sign    Worried About Running Out of Food in the Last Year: Never true    Ran Out of Food in the Last Year: Never true  Transportation Needs: No Transportation Needs (01/29/2023)   PRAPARE - Administrator, Civil Service (Medical): No    Lack of Transportation (Non-Medical): No  Physical Activity: Insufficiently Active (01/29/2023)   Exercise Vital Sign    Days of Exercise per Week: 2 days    Minutes of Exercise per Session: 30 min  Stress: No Stress Concern Present (01/29/2023)   [employer] of Occupational Health - Occupational Stress Questionnaire    Feeling of Stress : Only a little  Social Connections: Moderately Isolated (01/29/2023)   Social Connection and Isolation Panel [NHANES]  Frequency of Communication with Friends and Family: Once a week    Frequency of Social Gatherings with Friends and Family: Once a week    Attends Religious Services: More than 4 times per year    Active Member of [employer] or Organizations: No    Attends Engineer, structural: Not on file    Marital Status: Married  Catering manager Violence: Not on file    Outpatient Medications Prior to Visit  Medication Sig Dispense Refill   cyclobenzaprine (FLEXERIL) 10 MG tablet Take 1 tablet (10 mg total) by mouth 3 (three) times daily as needed for muscle spasms. 30 tablet 0   fenofibrate 160 MG tablet TAKE 1 TABLET BY MOUTH EVERY DAY 90 tablet 1   losartan (COZAAR) 100 MG tablet TAKE 1 TABLET BY MOUTH EVERY DAY 90 tablet 1   rosuvastatin (CRESTOR) 10 MG tablet TAKE 1 TABLET BY MOUTH EVERYDAY AT BEDTIME 90 tablet 1   Semaglutide-Weight Management (WEGOVY) 1.7 MG/0.75ML SOAJ Inject 1.7 mg into the skin once a week. 3 mL 3   No facility-administered medications prior to visit.    No Known Allergies  Review of  Systems  Constitutional:  Negative for fever and malaise/fatigue.  HENT:  Negative for congestion.   Eyes:  Negative for blurred vision.  Respiratory:  Negative for cough and shortness of breath.   Cardiovascular:  Negative for chest pain, palpitations and leg swelling.  Gastrointestinal:  Negative for vomiting.  Musculoskeletal:  Negative for back pain.  Skin:  Negative for rash.  Neurological:  Negative for loss of consciousness and headaches.       Objective:    Physical Exam Vitals and nursing note reviewed.  Constitutional:      General: He is not in acute distress.    Appearance: Normal appearance. He is not ill-appearing.  HENT:     Head: Normocephalic and atraumatic.     Right Ear: External ear normal.     Left Ear: External ear normal.  Eyes:     Extraocular Movements: Extraocular movements intact.     Pupils: Pupils are equal, round, and reactive to light.  Cardiovascular:     Rate and Rhythm: Normal rate and regular rhythm.     Heart sounds: Normal heart sounds. No murmur heard.    No gallop.  Pulmonary:     Effort: Pulmonary effort is normal. No respiratory distress.     Breath sounds: Normal breath sounds. No wheezing or rales.  Skin:    General: Skin is warm and dry.  Neurological:     Mental Status: He is alert and oriented to person, place, and time.  Psychiatric:        Judgment: Judgment normal.     BP 118/78 (BP Location: Right Arm, Patient Position: Sitting, Cuff Size: Large)   Pulse 76   Ht 5\' 11"  (1.803 m)   Wt 265 lb (120.2 kg)   SpO2 98%   BMI 36.96 kg/m  Wt Readings from Last 3 Encounters:  01/29/23 265 lb (120.2 kg)  11/27/22 262 lb 3.2 oz (118.9 kg)  07/29/22 273 lb 6.4 oz (124 kg)       Assessment & Plan:  Morbid obesity -     BJYNWG; Inject 2.4 mg into the skin once a week.  Dispense: 3 mL; Refill: 5  Primary hypertension Assessment & Plan: Well controlled, no changes to meds. Encouraged heart healthy diet such as the DASH  diet and exercise as tolerated.    Orders: -  Comprehensive metabolic panel  Hyperlipidemia, unspecified hyperlipidemia type Assessment & Plan: Encourage heart healthy diet such as MIND or DASH diet, increase exercise, avoid trans fats, simple carbohydrates and processed foods, consider a krill or fish or flaxseed oil cap daily.    Orders: -     CBC with Differential/Platelet -     Comprehensive metabolic panel -     TSH -     Lipid panel    I, Maurice Schultz, DO, personally preformed the services described in this documentation.  All medical record entries made by the scribe were at my direction and in my presence.  I have reviewed the chart and discharge instructions (if applicable) and agree that the record reflects my personal performance and is accurate and complete. 01/29/2023   I,Maurice Jimenez,acting as a scribe for Maurice Schultz, DO.,have documented all relevant documentation on the behalf of Maurice Schultz, DO,as directed by  Maurice Schultz, DO while in the presence of Maurice Schultz, DO.   Maurice Schultz, DO

## 2023-01-29 NOTE — Assessment & Plan Note (Signed)
Encourage heart healthy diet such as MIND or DASH diet, increase exercise, avoid trans fats, simple carbohydrates and processed foods, consider a krill or fish or flaxseed oil cap daily.  °

## 2023-01-30 ENCOUNTER — Other Ambulatory Visit: Payer: Self-pay

## 2023-04-12 ENCOUNTER — Other Ambulatory Visit: Payer: Self-pay | Admitting: Family Medicine

## 2023-04-24 ENCOUNTER — Other Ambulatory Visit (HOSPITAL_COMMUNITY): Payer: Self-pay

## 2023-04-26 ENCOUNTER — Other Ambulatory Visit: Payer: Self-pay | Admitting: Family Medicine

## 2023-04-29 ENCOUNTER — Other Ambulatory Visit (HOSPITAL_COMMUNITY): Payer: Self-pay

## 2023-07-18 ENCOUNTER — Other Ambulatory Visit: Payer: Self-pay | Admitting: Family Medicine

## 2023-07-18 DIAGNOSIS — I10 Essential (primary) hypertension: Secondary | ICD-10-CM

## 2023-07-24 ENCOUNTER — Other Ambulatory Visit (HOSPITAL_COMMUNITY): Payer: Self-pay

## 2023-07-24 ENCOUNTER — Other Ambulatory Visit: Payer: Self-pay | Admitting: Family Medicine

## 2023-07-24 MED ORDER — WEGOVY 2.4 MG/0.75ML ~~LOC~~ SOAJ
2.4000 mg | SUBCUTANEOUS | 5 refills | Status: DC
Start: 2023-07-24 — End: 2023-09-16
  Filled 2023-07-24 (×2): qty 3, 28d supply, fill #0
  Filled 2023-08-19: qty 3, 28d supply, fill #1

## 2023-08-06 ENCOUNTER — Ambulatory Visit: Payer: 59 | Admitting: Family Medicine

## 2023-08-06 ENCOUNTER — Encounter: Payer: Self-pay | Admitting: Family Medicine

## 2023-08-06 VITALS — BP 112/80 | HR 100 | Temp 98.5°F | Resp 18 | Ht 71.0 in | Wt 266.0 lb

## 2023-08-06 DIAGNOSIS — I10 Essential (primary) hypertension: Secondary | ICD-10-CM

## 2023-08-06 DIAGNOSIS — Z23 Encounter for immunization: Secondary | ICD-10-CM

## 2023-08-06 DIAGNOSIS — Z0000 Encounter for general adult medical examination without abnormal findings: Secondary | ICD-10-CM | POA: Diagnosis not present

## 2023-08-06 DIAGNOSIS — E785 Hyperlipidemia, unspecified: Secondary | ICD-10-CM | POA: Diagnosis not present

## 2023-08-06 LAB — COMPREHENSIVE METABOLIC PANEL
ALT: 41 U/L (ref 0–53)
AST: 23 U/L (ref 0–37)
Albumin: 4.8 g/dL (ref 3.5–5.2)
Alkaline Phosphatase: 43 U/L (ref 39–117)
BUN: 16 mg/dL (ref 6–23)
CO2: 30 meq/L (ref 19–32)
Calcium: 9.9 mg/dL (ref 8.4–10.5)
Chloride: 103 meq/L (ref 96–112)
Creatinine, Ser: 1.19 mg/dL (ref 0.40–1.50)
GFR: 77.27 mL/min (ref >60.00)
Glucose, Bld: 72 mg/dL (ref 70–99)
Potassium: 4.3 meq/L (ref 3.5–5.1)
Sodium: 140 meq/L (ref 135–145)
Total Bilirubin: 0.9 mg/dL (ref 0.2–1.2)
Total Protein: 7.3 g/dL (ref 6.0–8.3)

## 2023-08-06 LAB — CBC WITH DIFFERENTIAL/PLATELET
Basophils Absolute: 0.1 10*3/uL (ref 0.0–0.1)
Basophils Relative: 0.9 % (ref 0.0–3.0)
Eosinophils Absolute: 0.2 10*3/uL (ref 0.0–0.7)
Eosinophils Relative: 2 % (ref 0.0–5.0)
HCT: 45.3 % (ref 39.0–52.0)
Hemoglobin: 14.9 g/dL (ref 13.0–17.0)
Lymphocytes Relative: 33.7 % (ref 12.0–46.0)
Lymphs Abs: 2.9 10*3/uL (ref 0.7–4.0)
MCHC: 32.8 g/dL (ref 30.0–36.0)
MCV: 91.1 fL (ref 78.0–100.0)
Monocytes Absolute: 0.7 10*3/uL (ref 0.1–1.0)
Monocytes Relative: 8.2 % (ref 3.0–12.0)
Neutro Abs: 4.7 10*3/uL (ref 1.4–7.7)
Neutrophils Relative %: 55.2 % (ref 43.0–77.0)
Platelets: 282 10*3/uL (ref 150.0–400.0)
RBC: 4.97 Mil/uL (ref 4.22–5.81)
RDW: 12.5 % (ref 11.5–15.5)
WBC: 8.5 10*3/uL (ref 4.0–10.5)

## 2023-08-06 LAB — LIPID PANEL
Cholesterol: 123 mg/dL (ref 0–200)
HDL: 39.1 mg/dL (ref >39.00)
LDL Cholesterol: 62 mg/dL (ref 0–99)
NonHDL: 83.73
Total CHOL/HDL Ratio: 3
Triglycerides: 110 mg/dL (ref 0.0–149.0)
VLDL: 22 mg/dL (ref 0.0–40.0)

## 2023-08-06 LAB — TSH: TSH: 1.65 u[IU]/mL (ref 0.35–5.50)

## 2023-08-06 LAB — VITAMIN B12: Vitamin B-12: 212 pg/mL (ref 211–911)

## 2023-08-06 LAB — VITAMIN D 25 HYDROXY (VIT D DEFICIENCY, FRACTURES): VITD: 14.56 ng/mL — ABNORMAL LOW (ref 30.00–100.00)

## 2023-08-06 NOTE — Assessment & Plan Note (Signed)
Encourage heart healthy diet such as MIND or DASH diet, increase exercise, avoid trans fats, simple carbohydrates and processed foods, consider a krill or fish or flaxseed oil cap daily.  °

## 2023-08-06 NOTE — Assessment & Plan Note (Signed)
Well controlled, no changes to meds. Encouraged heart healthy diet such as the DASH diet and exercise as tolerated.  °

## 2023-08-06 NOTE — Progress Notes (Signed)
Established Patient Office Visit  Subjective   Patient ID: Maurice Jimenez, male    DOB: 23-Nov-1985  Age: 37 y.o. MRN: [id number]  Chief Complaint  Patient presents with   Annual Exam    Pt states not fasting     HPI Discussed the use of AI scribe software for clinical note transcription with the patient, who gave verbal consent to proceed.  History of Present Illness   The patient, with a history of obesity, presents for a routine physical. He has been on [facility] for weight loss for about a year and has lost approximately 30 pounds. He reports a brief interruption in the medication due to a refill issue and a trip to the mountains. Despite this, his weight has remained stable. He reports a significant decrease in his intake of greasy foods and alcohol, and an increase in his consumption of grilled foods. He also reports a decrease in his overall food intake. He denies any recent illnesses or changes in his family history. He reports daily walking for exercise. He also reports occasional knee pain, but denies any recent injuries or broken bones.      Patient Active Problem List   Diagnosis Date Noted   Lymphadenopathy 11/27/2022   Morbid obesity (HCC) 01/02/2022   Hyperglycemia 04/29/2020   Hyperlipidemia 04/29/2020   Chronic bilateral low back pain without sciatica 04/29/2020   Acute left-sided low back pain without sciatica 01/19/2020   Mass of soft tissue of chest 01/19/2020   Primary hypertension 01/19/2020   Abnormal EKG 12/25/2014   LVH (left ventricular hypertrophy) 12/25/2014   Anxiety state 11/27/2014   Past Medical History:  Diagnosis Date   GERD (gastroesophageal reflux disease)    History of chicken pox    Ingrown toenail    Past Surgical History:  Procedure Laterality Date   IRRIGATION AND DEBRIDEMENT SEBACEOUS CYST     l upper arm   NO PAST SURGERIES     Social History   Tobacco Use   Smoking status: Never   Smokeless tobacco: Never  Vaping Use    Vaping status: Never Used  Substance Use Topics   Alcohol use: Not Currently    Comment: rare   Drug use: No   Social History   Socioeconomic History   Marital status: Married    Spouse name: brooke    Number of children: Not on file   Years of education: Not on file   Highest education level: Some college, no degree  Occupational History   Occupation: help Producer, television/film/video: [employer]  Tobacco Use   Smoking status: Never   Smokeless tobacco: Never  Vaping Use   Vaping status: Never Used  Substance and Sexual Activity   Alcohol use: Not Currently    Comment: rare   Drug use: No   Sexual activity: Yes    Partners: Female  Other Topics Concern   Not on file  Social History Narrative   Walking daily   Social Determinants of Health   Financial Resource Strain: Medium Risk (01/29/2023)   Overall Financial Resource Strain (CARDIA)    Difficulty of Paying Living Expenses: Somewhat hard  Food Insecurity: No Food Insecurity (01/29/2023)   Hunger Vital Sign    Worried About Running Out of Food in the Last Year: Never true    Ran Out of Food in the Last Year: Never true  Transportation Needs: No Transportation Needs (01/29/2023)   PRAPARE - Administrator, Civil Service (Medical):  No    Lack of Transportation (Non-Medical): No  Physical Activity: Insufficiently Active (01/29/2023)   Exercise Vital Sign    Days of Exercise per Week: 2 days    Minutes of Exercise per Session: 30 min  Stress: No Stress Concern Present (01/29/2023)   [employer] of Occupational Health - Occupational Stress Questionnaire    Feeling of Stress : Only a little  Social Connections: Moderately Isolated (01/29/2023)   Social Connection and Isolation Panel [NHANES]    Frequency of Communication with Friends and Family: Once a week    Frequency of Social Gatherings with Friends and Family: Once a week    Attends Religious Services: More than 4 times per year    Active Member of [employer] or  Organizations: No    Attends Engineer, structural: Not on file    Marital Status: Married  Catering manager Violence: Not on file   Family Status  Relation Name Status   Other  (Not Specified)   Mother  Alive   Father  Alive   Brother  Alive   Sister  Alive   Son  (Not Specified)   Daughter  (Not Specified)   MGM  Alive   MGF  Deceased   PGM  Alive   PGF  Deceased   Sister  Alive  No partnership data on file   Family History  Problem Relation Age of Onset   Diabetes Other        Paternal side   Healthy Mother        Living   Healthy Father        Living   Diabetes Brother        x1   Healthy Sister        x2   Healthy Son        x1   Healthy Daughter        x1   No Known Allergies    Review of Systems  Constitutional:  Negative for chills, fever and malaise/fatigue.  HENT:  Negative for congestion and hearing loss.   Eyes:  Negative for blurred vision and discharge.  Respiratory:  Negative for cough, sputum production and shortness of breath.   Cardiovascular:  Negative for chest pain, palpitations and leg swelling.  Gastrointestinal:  Negative for abdominal pain, blood in stool, constipation, diarrhea, heartburn, nausea and vomiting.  Genitourinary:  Negative for dysuria, frequency, hematuria and urgency.  Musculoskeletal:  Negative for back pain, falls and myalgias.  Skin:  Negative for rash.  Neurological:  Negative for dizziness, sensory change, loss of consciousness, weakness and headaches.  Endo/Heme/Allergies:  Negative for environmental allergies. Does not bruise/bleed easily.  Psychiatric/Behavioral:  Negative for depression and suicidal ideas. The patient is not nervous/anxious and does not have insomnia.       Objective:     BP 112/80 (BP Location: Right Arm, Patient Position: Sitting, Cuff Size: Large)   Pulse 100   Temp 98.5 F (36.9 C) (Oral)   Resp 18   Ht 5\' 11"  (1.803 m)   Wt 266 lb (120.7 kg)   SpO2 97%   BMI 37.10 kg/m   BP Readings from Last 3 Encounters:  08/06/23 112/80  01/29/23 118/78  11/27/22 120/84   Wt Readings from Last 3 Encounters:  08/06/23 266 lb (120.7 kg)  01/29/23 265 lb (120.2 kg)  11/27/22 262 lb 3.2 oz (118.9 kg)   SpO2 Readings from Last 3 Encounters:  08/06/23 97%  01/29/23 98%  11/27/22  98%      Physical Exam Vitals and nursing note reviewed.  Constitutional:      General: He is not in acute distress.    Appearance: Normal appearance. He is well-developed.  HENT:     Head: Normocephalic and atraumatic.     Right Ear: Tympanic membrane, ear canal and external ear normal. There is no impacted cerumen.     Left Ear: Tympanic membrane, ear canal and external ear normal. There is no impacted cerumen.     Nose: Nose normal.     Mouth/Throat:     Mouth: Mucous membranes are moist.     Pharynx: Oropharynx is clear. No oropharyngeal exudate or posterior oropharyngeal erythema.  Eyes:     General: No scleral icterus.       Right eye: No discharge.        Left eye: No discharge.     Conjunctiva/sclera: Conjunctivae normal.     Pupils: Pupils are equal, round, and reactive to light.  Neck:     Thyroid: No thyromegaly.     Vascular: No JVD.  Cardiovascular:     Rate and Rhythm: Normal rate and regular rhythm.     Heart sounds: Normal heart sounds. No murmur heard. Pulmonary:     Effort: Pulmonary effort is normal. No respiratory distress.     Breath sounds: Normal breath sounds.  Abdominal:     General: Bowel sounds are normal. There is no distension.     Palpations: Abdomen is soft. There is no mass.     Tenderness: There is no abdominal tenderness. There is no guarding or rebound.  Musculoskeletal:        General: Normal range of motion.     Cervical back: Normal range of motion and neck supple.     Right lower leg: No edema.     Left lower leg: No edema.  Lymphadenopathy:     Cervical: No cervical adenopathy.  Skin:    General: Skin is warm and dry.      Findings: No erythema or rash.  Neurological:     Mental Status: He is alert and oriented to person, place, and time.     Cranial Nerves: No cranial nerve deficit.     Motor: No abnormal muscle tone.     Deep Tendon Reflexes: Reflexes are normal and symmetric. Reflexes normal.  Psychiatric:        Mood and Affect: Mood normal.        Behavior: Behavior normal.        Thought Content: Thought content normal.        Judgment: Judgment normal.      No results found for any visits on 08/06/23.  Last CBC Lab Results  Component Value Date   WBC 6.0 01/29/2023   HGB 14.6 01/29/2023   HCT 43.7 01/29/2023   MCV 89.3 01/29/2023   MCH 30.1 11/22/2014   RDW 12.9 01/29/2023   PLT 248.0 01/29/2023   Last metabolic panel Lab Results  Component Value Date   GLUCOSE 85 01/29/2023   NA 142 01/29/2023   K 4.3 01/29/2023   CL 104 01/29/2023   CO2 30 01/29/2023   BUN 14 01/29/2023   CREATININE 1.23 01/29/2023   GFR 74.53 01/29/2023   CALCIUM 9.7 01/29/2023   PROT 7.2 01/29/2023   ALBUMIN 4.7 01/29/2023   BILITOT 0.7 01/29/2023   ALKPHOS 40 01/29/2023   AST 23 01/29/2023   ALT 34 01/29/2023   ANIONGAP 12 11/22/2014  Last lipids Lab Results  Component Value Date   CHOL 101 01/29/2023   HDL 40.80 01/29/2023   LDLCALC 49 01/29/2023   LDLDIRECT 123.0 04/24/2020   TRIG 57.0 01/29/2023   CHOLHDL 2 01/29/2023   Last hemoglobin A1c Lab Results  Component Value Date   HGBA1C 5.7 07/29/2022   Last thyroid functions Lab Results  Component Value Date   TSH 1.71 01/29/2023      The ASCVD Risk score (Arnett DK, et al., 2019) failed to calculate for the following reasons:   The 2019 ASCVD risk score is only valid for ages 26 to 25    Assessment & Plan:   Problem List Items Addressed This Visit       Unprioritized   Morbid obesity (HCC)   Relevant Orders   Vitamin B12   VITAMIN D 25 Hydroxy (Vit-D Deficiency, Fractures)   Insulin, random   Primary hypertension    Well  controlled, no changes to meds. Encouraged heart healthy diet such as the DASH diet and exercise as tolerated.        Relevant Orders   CBC with Differential/Platelet   TSH   Hyperlipidemia    Encourage heart healthy diet such as MIND or DASH diet, increase exercise, avoid trans fats, simple carbohydrates and processed foods, consider a krill or fish or flaxseed oil cap daily.        Relevant Orders   Comprehensive metabolic panel   Lipid panel   Other Visit Diagnoses     Preventative health care    -  Primary   Relevant Orders   CBC with Differential/Platelet   Comprehensive metabolic panel   Lipid panel   TSH   Need for Tdap vaccination       Relevant Orders   Tdap vaccine greater than or equal to 7yo IM (Completed)     Assessment and Plan    Obesity Weight stable at 266 lbs, down from 293 lbs. Patient reports decreased appetite and improved dietary habits while on Wegovy. Brief interruption in [place] use due to refill issues. -Continue Wegovy. -Encourage continued dietary improvements and increased physical activity.  General Health Maintenance -Administer tetanus vaccine today. -Order routine blood work.        No follow-ups on file.    Donato Schultz, DO

## 2023-08-07 ENCOUNTER — Other Ambulatory Visit: Payer: 59

## 2023-08-10 LAB — INSULIN, RANDOM: Insulin: 44.7 u[IU]/mL — ABNORMAL HIGH

## 2023-09-16 ENCOUNTER — Other Ambulatory Visit: Payer: Self-pay | Admitting: Family Medicine

## 2023-09-16 ENCOUNTER — Encounter: Payer: Self-pay | Admitting: Family Medicine

## 2023-10-23 ENCOUNTER — Other Ambulatory Visit: Payer: Self-pay | Admitting: Family Medicine

## 2023-11-15 ENCOUNTER — Other Ambulatory Visit: Payer: Self-pay | Admitting: Family Medicine

## 2024-01-30 ENCOUNTER — Other Ambulatory Visit: Payer: Self-pay | Admitting: Family Medicine

## 2024-01-30 DIAGNOSIS — I10 Essential (primary) hypertension: Secondary | ICD-10-CM

## 2024-02-04 ENCOUNTER — Other Ambulatory Visit: Payer: Self-pay | Admitting: Family Medicine

## 2024-02-04 ENCOUNTER — Other Ambulatory Visit (HOSPITAL_COMMUNITY): Payer: Self-pay

## 2024-02-04 ENCOUNTER — Telehealth: Payer: Self-pay

## 2024-02-04 ENCOUNTER — Encounter: Payer: Self-pay | Admitting: Family Medicine

## 2024-02-04 ENCOUNTER — Ambulatory Visit: Payer: 59 | Admitting: Family Medicine

## 2024-02-04 VITALS — BP 128/88 | HR 78 | Temp 98.5°F | Resp 16 | Ht 71.0 in | Wt 287.8 lb

## 2024-02-04 DIAGNOSIS — E785 Hyperlipidemia, unspecified: Secondary | ICD-10-CM

## 2024-02-04 DIAGNOSIS — I10 Essential (primary) hypertension: Secondary | ICD-10-CM

## 2024-02-04 LAB — LIPID PANEL
Cholesterol: 120 mg/dL (ref 0–200)
HDL: 42 mg/dL (ref >39.00)
LDL Cholesterol: 63 mg/dL (ref 0–99)
NonHDL: 78.16
Total CHOL/HDL Ratio: 3
Triglycerides: 76 mg/dL (ref 0.0–149.0)
VLDL: 15.2 mg/dL (ref 0.0–40.0)

## 2024-02-04 LAB — COMPREHENSIVE METABOLIC PANEL WITH GFR
ALT: 33 U/L (ref 0–53)
AST: 21 U/L (ref 0–37)
Albumin: 4.8 g/dL (ref 3.5–5.2)
Alkaline Phosphatase: 37 U/L — ABNORMAL LOW (ref 39–117)
BUN: 17 mg/dL (ref 6–23)
CO2: 29 meq/L (ref 19–32)
Calcium: 9.9 mg/dL (ref 8.4–10.5)
Chloride: 102 meq/L (ref 96–112)
Creatinine, Ser: 1.14 mg/dL (ref 0.40–1.50)
GFR: 81.07 mL/min (ref >60.00)
Glucose, Bld: 97 mg/dL (ref 70–99)
Potassium: 4 meq/L (ref 3.5–5.1)
Sodium: 140 meq/L (ref 135–145)
Total Bilirubin: 0.9 mg/dL (ref 0.2–1.2)
Total Protein: 7.5 g/dL (ref 6.0–8.3)

## 2024-02-04 MED ORDER — TIRZEPATIDE-WEIGHT MANAGEMENT 2.5 MG/0.5ML ~~LOC~~ SOLN: 2.5000 mg | SUBCUTANEOUS | 0 refills | Status: DC

## 2024-02-04 NOTE — Progress Notes (Signed)
 Established Patient Office Visit  Subjective   Patient ID: Maurice Jimenez, male    DOB: October 23, 1985  Age: 38 y.o. MRN: [id number]  Chief Complaint  Patient presents with   Hypertension   Hyperlipidemia   Follow-up    HPI Discussed the use of AI scribe software for clinical note transcription with the patient, who gave verbal consent to proceed.  History of Present Illness Maurice Jimenez is a 38 year old male who presents with worsening allergies after a trip to [place] .  He has experienced worsening allergies following a recent trip to [place] , characterized by a runny nose without nasal congestion. He is currently using Alembra for allergy management but has not found relief with Flonase. He is considering trying Astepro, an over-the-counter nasal spray, for better symptom control.  He is inquiring about weight loss options that do not cause nausea. He previously tried Wegovy , which resulted in nausea at mid-range doses. He is aware of other medications like Contrave and Qsymia but is concerned about their side effects, particularly on blood pressure. His wife is on Mounjaro  for blood sugar management due to her insulin  resistance and borderline diabetes.  No sleep apnea or use of a CPAP machine, although his wife reports that he snores. No episodes of stopped breathing during sleep.   Patient Active Problem List   Diagnosis Date Noted   Lymphadenopathy 11/27/2022   Morbid obesity (HCC) 01/02/2022   Hyperglycemia 04/29/2020   Hyperlipidemia 04/29/2020   Chronic bilateral low back pain without sciatica 04/29/2020   Acute left-sided low back pain without sciatica 01/19/2020   Mass of soft tissue of chest 01/19/2020   Primary hypertension 01/19/2020   Abnormal EKG 12/25/2014   LVH (left ventricular hypertrophy) 12/25/2014   Anxiety state 11/27/2014   Past Medical History:  Diagnosis Date   GERD (gastroesophageal reflux disease)    History of chicken pox    Ingrown toenail     Past Surgical History:  Procedure Laterality Date   IRRIGATION AND DEBRIDEMENT SEBACEOUS CYST     l upper arm   NO PAST SURGERIES     Social History   Tobacco Use   Smoking status: Never   Smokeless tobacco: Never  Vaping Use   Vaping status: Never Used  Substance Use Topics   Alcohol use: Not Currently    Comment: rare   Drug use: No   Social History   Socioeconomic History   Marital status: Married    Spouse name: brooke    Number of children: Not on file   Years of education: Not on file   Highest education level: Some college, no degree  Occupational History   Occupation: help Producer, television/film/video: [employer]  Tobacco Use   Smoking status: Never   Smokeless tobacco: Never  Vaping Use   Vaping status: Never Used  Substance and Sexual Activity   Alcohol use: Not Currently    Comment: rare   Drug use: No   Sexual activity: Yes    Partners: Female  Other Topics Concern   Not on file  Social History Narrative   Walking daily   Social Drivers of Health   Financial Resource Strain: Medium Risk (01/29/2023)   Overall Financial Resource Strain (CARDIA)    Difficulty of Paying Living Expenses: Somewhat hard  Food Insecurity: No Food Insecurity (01/29/2023)   Hunger Vital Sign    Worried About Running Out of Food in the Last Year: Never true    [employer] of Food  in the Last Year: Never true  Transportation Needs: No Transportation Needs (01/29/2023)   PRAPARE - Administrator, Civil Service (Medical): No    Lack of Transportation (Non-Medical): No  Physical Activity: Insufficiently Active (01/29/2023)   Exercise Vital Sign    Days of Exercise per Week: 2 days    Minutes of Exercise per Session: 30 min  Stress: No Stress Concern Present (01/29/2023)   [employer] of Occupational Health - Occupational Stress Questionnaire    Feeling of Stress : Only a little  Social Connections: Moderately Isolated (01/29/2023)   Social Connection and Isolation Panel  [NHANES]    Frequency of Communication with Friends and Family: Once a week    Frequency of Social Gatherings with Friends and Family: Once a week    Attends Religious Services: More than 4 times per year    Active Member of [employer] or Organizations: No    Attends Engineer, structural: Not on file    Marital Status: Married  Catering manager Violence: Not on file   Family Status  Relation Name Status   Other  (Not Specified)   Mother  Alive   Father  Alive   Brother  Alive   Sister  Alive   Son  (Not Specified)   Daughter  (Not Specified)   MGM  Alive   MGF  Deceased   PGM  Alive   PGF  Deceased   Sister  Alive  No partnership data on file   Family History  Problem Relation Age of Onset   Diabetes Other        Paternal side   Healthy Mother        Living   Healthy Father        Living   Diabetes Brother        x1   Healthy Sister        x2   Healthy Son        x1   Healthy Daughter        x1   No Known Allergies    Review of Systems  Constitutional:  Negative for fever and malaise/fatigue.  HENT:  Negative for congestion.   Eyes:  Negative for blurred vision.  Respiratory:  Negative for cough and shortness of breath.   Cardiovascular:  Negative for chest pain, palpitations and leg swelling.  Gastrointestinal:  Negative for vomiting.  Musculoskeletal:  Negative for back pain.  Skin:  Negative for rash.  Neurological:  Negative for loss of consciousness and headaches.      Objective:     BP 128/88 (BP Location: Left Arm, Patient Position: Sitting, Cuff Size: Large)   Pulse 78   Temp 98.5 F (36.9 C) (Oral)   Resp 16   Ht 5\' 11"  (1.803 m)   Wt 287 lb 12.8 oz (130.5 kg)   SpO2 97%   BMI 40.14 kg/m  BP Readings from Last 3 Encounters:  02/04/24 128/88  08/06/23 112/80  01/29/23 118/78   Wt Readings from Last 3 Encounters:  02/04/24 287 lb 12.8 oz (130.5 kg)  08/06/23 266 lb (120.7 kg)  01/29/23 265 lb (120.2 kg)   SpO2 Readings from  Last 3 Encounters:  02/04/24 97%  08/06/23 97%  01/29/23 98%      Physical Exam Vitals and nursing note reviewed.  Constitutional:      General: He is not in acute distress.    Appearance: Normal appearance. He is well-developed.  HENT:  Head: Normocephalic and atraumatic.     Nose: Rhinorrhea present.     Mouth/Throat:     Pharynx: No oropharyngeal exudate or posterior oropharyngeal erythema.  Eyes:     General: No scleral icterus.       Right eye: No discharge.        Left eye: No discharge.  Cardiovascular:     Rate and Rhythm: Normal rate and regular rhythm.     Heart sounds: No murmur heard. Pulmonary:     Effort: Pulmonary effort is normal. No respiratory distress.     Breath sounds: Normal breath sounds.  Musculoskeletal:        General: Normal range of motion.     Cervical back: Normal range of motion and neck supple.     Right lower leg: No edema.     Left lower leg: No edema.  Skin:    General: Skin is warm and dry.  Neurological:     Mental Status: He is alert and oriented to person, place, and time.  Psychiatric:        Mood and Affect: Mood normal.        Behavior: Behavior normal.        Thought Content: Thought content normal.        Judgment: Judgment normal.      No results found for any visits on 02/04/24.  Last CBC Lab Results  Component Value Date   WBC 8.5 08/06/2023   HGB 14.9 08/06/2023   HCT 45.3 08/06/2023   MCV 91.1 08/06/2023   MCH 30.1 11/22/2014   RDW 12.5 08/06/2023   PLT 282.0 08/06/2023   Last metabolic panel Lab Results  Component Value Date   GLUCOSE 72 08/06/2023   NA 140 08/06/2023   K 4.3 08/06/2023   CL 103 08/06/2023   CO2 30 08/06/2023   BUN 16 08/06/2023   CREATININE 1.19 08/06/2023   GFR 77.27 08/06/2023   CALCIUM  9.9 08/06/2023   PROT 7.3 08/06/2023   ALBUMIN 4.8 08/06/2023   BILITOT 0.9 08/06/2023   ALKPHOS 43 08/06/2023   AST 23 08/06/2023   ALT 41 08/06/2023   ANIONGAP 12 11/22/2014   Last  lipids Lab Results  Component Value Date   CHOL 123 08/06/2023   HDL 39.10 08/06/2023   LDLCALC 62 08/06/2023   LDLDIRECT 123.0 04/24/2020   TRIG 110.0 08/06/2023   CHOLHDL 3 08/06/2023   Last hemoglobin A1c Lab Results  Component Value Date   HGBA1C 5.7 07/29/2022   Last thyroid  functions Lab Results  Component Value Date   TSH 1.65 08/06/2023   Last vitamin D  Lab Results  Component Value Date   VD25OH 14.56 (L) 08/06/2023   Last vitamin B12 and Folate Lab Results  Component Value Date   VITAMINB12 212 08/06/2023      The ASCVD Risk score (Arnett DK, et al., 2019) failed to calculate for the following reasons:   The 2019 ASCVD risk score is only valid for ages 59 to 91    Assessment & Plan:   Problem List Items Addressed This Visit       Unprioritized   Primary hypertension   Relevant Medications   tirzepatide (ZEPBOUND) 2.5 MG/0.5ML injection vial   Other Relevant Orders   Comprehensive metabolic panel with GFR   Lipid panel   Hyperlipidemia - Primary   Relevant Medications   tirzepatide (ZEPBOUND) 2.5 MG/0.5ML injection vial   Other Relevant Orders   Comprehensive metabolic panel with GFR  Lipid panel  Assessment and Plan Assessment & Plan Allergic rhinitis   Chronic allergic rhinitis has worsened due to recent travel to [place] , causing nasal congestion and rhinorrhea. He is currently using Alembra. Astepro nasal spray is recommended over the counter for better symptom control compared to Flonase. Saline nasal spray may provide additional relief for rhinorrhea.  Obesity   He is managing obesity with previous use of Wegovy . Alternative weight loss medications were discussed, including Zepbound Maurice Jimenez), which may cause less nausea. Dose titration is important to optimize results and minimize side effects, with some achieving satisfactory weight loss at lower doses like 5 mg. A trial of Zepbound (Mounjaro) is considered, starting at 2.5 mg and  increasing to 5 mg as tolerated. Monitor for side effects, especially nausea, and adjust the dose as needed. Follow up to evaluate weight loss progress and medication tolerance.    No follow-ups on file.    Maurice Zwahlen R Lowne Chase, DO

## 2024-02-04 NOTE — Telephone Encounter (Signed)
 Pharmacy Patient Advocate Encounter   Received notification from Patient Advice Request messages that prior authorization for Zepbound 2.5MG /0.5ML pen-injectors is required/requested.   Insurance verification completed.   The patient is insured through [facility] ADVANTAGE/RX ADVANCE .   Per test claim: PA required and submitted KEY/EOC/Request #: [id number] APPROVED from 02/04/2024 to 10/01/2024. Ran test claim, Copay is $100.00, without manufacturer coupon; $24.99 with manufacturer coupon. This test claim was processed through [facility]- copay amounts may vary at other pharmacies due to pharmacy/plan contracts, or as the patient moves through the different stages of their insurance plan.

## 2024-02-04 NOTE — Patient Instructions (Signed)

## 2024-02-04 NOTE — Telephone Encounter (Signed)
 PA for Zepbound PEN has been approved, script is written for vials, please change if clinically appropriate.   Please sign off on rx in this encounter as PA team is unable to resolve RX requests. Thank you

## 2024-02-13 ENCOUNTER — Encounter: Payer: Self-pay | Admitting: Family Medicine

## 2024-03-09 ENCOUNTER — Other Ambulatory Visit: Payer: Self-pay | Admitting: Family Medicine

## 2024-03-09 DIAGNOSIS — E785 Hyperlipidemia, unspecified: Secondary | ICD-10-CM

## 2024-03-09 DIAGNOSIS — I10 Essential (primary) hypertension: Secondary | ICD-10-CM

## 2024-03-15 ENCOUNTER — Encounter: Payer: Self-pay | Admitting: Family Medicine

## 2024-04-11 ENCOUNTER — Encounter: Payer: Self-pay | Admitting: Family Medicine

## 2024-04-12 ENCOUNTER — Other Ambulatory Visit (HOSPITAL_COMMUNITY): Payer: Self-pay

## 2024-04-12 ENCOUNTER — Other Ambulatory Visit: Payer: Self-pay | Admitting: Family Medicine

## 2024-04-12 MED ORDER — TIRZEPATIDE-WEIGHT MANAGEMENT 5 MG/0.5ML ~~LOC~~ SOAJ
5.0000 mg | SUBCUTANEOUS | 0 refills | Status: DC
  Filled 2024-04-12 – 2024-05-09 (×3): qty 2, 28d supply, fill #0

## 2024-04-12 NOTE — Telephone Encounter (Signed)
 Okay to increase

## 2024-04-18 ENCOUNTER — Other Ambulatory Visit (HOSPITAL_COMMUNITY): Payer: Self-pay

## 2024-04-19 ENCOUNTER — Other Ambulatory Visit (HOSPITAL_COMMUNITY): Payer: Self-pay

## 2024-04-20 ENCOUNTER — Other Ambulatory Visit (HOSPITAL_COMMUNITY): Payer: Self-pay

## 2024-04-21 ENCOUNTER — Other Ambulatory Visit (HOSPITAL_COMMUNITY): Payer: Self-pay

## 2024-04-21 ENCOUNTER — Telehealth: Payer: Self-pay

## 2024-04-21 ENCOUNTER — Other Ambulatory Visit: Payer: Self-pay | Admitting: Family Medicine

## 2024-04-21 NOTE — Telephone Encounter (Signed)
 Pharmacy Patient Advocate Encounter   Received notification from Patient Pharmacy that prior authorization for Zepbound  5 mg/0.5 ml pen is required/requested.   Insurance verification completed.   The patient is insured through [facility] .   Per test claim:  Orlistat, Qsymia, Saxenda or Wegovy  is preferred by the insurance.  If suggested medication is appropriate, Please send in a new RX and discontinue this one. If not, please advise as to why it's not appropriate so that we may request a Prior Authorization. Please note, some preferred medications may still require a PA.  If the suggested medications have not been trialed and there are no contraindications to their use, the PA will not be submitted, as it will not be approved.

## 2024-04-25 ENCOUNTER — Other Ambulatory Visit (HOSPITAL_COMMUNITY): Payer: Self-pay

## 2024-04-25 ENCOUNTER — Telehealth: Payer: Self-pay

## 2024-04-25 NOTE — Telephone Encounter (Signed)
 Pharmacy Patient Advocate Encounter   Received notification from CoverMyMeds that prior authorization for Zepbound  10 is required/requested.   Insurance verification completed.   The patient is insured through [facility] . Patient plan does not cover Zepbound .   Per test claim:  Wegovy  is preferred by the insurance.  If suggested medication is appropriate, Please send in a new RX and discontinue this one. If not, please advise as to why it's not appropriate so that we may request a Prior Authorization. Please note, some preferred medications may still require a PA.  If the suggested medications have not been trialed and there are no contraindications to their use, the PA will not be submitted, as it will not be approved.  Patient has current Wegovy  PA approved thru 10/01/24. Patient co-pay is $24.99. See encounter 04/21/24.

## 2024-04-26 NOTE — Telephone Encounter (Signed)
 Pt stopped taking Wegovy  due to problems with acid reflux. See mychart message from 09/16/23

## 2024-04-30 ENCOUNTER — Other Ambulatory Visit: Payer: Self-pay | Admitting: Family Medicine

## 2024-05-09 ENCOUNTER — Other Ambulatory Visit (HOSPITAL_COMMUNITY): Payer: Self-pay

## 2024-05-12 ENCOUNTER — Other Ambulatory Visit (HOSPITAL_COMMUNITY): Payer: Self-pay

## 2024-05-13 ENCOUNTER — Telehealth: Admitting: Physician Assistant

## 2024-05-13 DIAGNOSIS — B9689 Other specified bacterial agents as the cause of diseases classified elsewhere: Secondary | ICD-10-CM | POA: Diagnosis not present

## 2024-05-13 DIAGNOSIS — J208 Acute bronchitis due to other specified organisms: Secondary | ICD-10-CM

## 2024-05-13 MED ORDER — AZITHROMYCIN 250 MG PO TABS: ORAL_TABLET | ORAL | 0 refills | Status: AC

## 2024-05-13 MED ORDER — BENZONATATE 100 MG PO CAPS: 100.0000 mg | ORAL_CAPSULE | Freq: Three times a day (TID) | ORAL | 0 refills | Status: DC | PRN

## 2024-05-13 NOTE — Progress Notes (Signed)

## 2024-06-09 ENCOUNTER — Other Ambulatory Visit: Payer: Self-pay | Admitting: Family Medicine

## 2024-06-10 ENCOUNTER — Other Ambulatory Visit (HOSPITAL_COMMUNITY): Payer: Self-pay

## 2024-06-15 ENCOUNTER — Other Ambulatory Visit (HOSPITAL_COMMUNITY): Payer: Self-pay

## 2024-06-15 ENCOUNTER — Telehealth (HOSPITAL_COMMUNITY): Payer: Self-pay

## 2024-06-15 MED ORDER — ZEPBOUND 5 MG/0.5ML ~~LOC~~ SOAJ
5.0000 mg | SUBCUTANEOUS | 0 refills | Status: DC
  Filled 2024-06-15: qty 2, 28d supply, fill #0

## 2024-06-16 ENCOUNTER — Other Ambulatory Visit: Payer: Self-pay

## 2024-06-16 ENCOUNTER — Other Ambulatory Visit (HOSPITAL_COMMUNITY): Payer: Self-pay

## 2024-08-03 ENCOUNTER — Other Ambulatory Visit: Payer: Self-pay | Admitting: Family Medicine

## 2024-08-03 DIAGNOSIS — I10 Essential (primary) hypertension: Secondary | ICD-10-CM

## 2024-08-08 ENCOUNTER — Encounter: Admitting: Family Medicine

## 2024-08-15 ENCOUNTER — Encounter: Admitting: Family Medicine

## 2024-08-25 ENCOUNTER — Encounter: Payer: Self-pay | Admitting: Family Medicine

## 2024-08-25 ENCOUNTER — Ambulatory Visit (INDEPENDENT_AMBULATORY_CARE_PROVIDER_SITE_OTHER): Admitting: Family Medicine

## 2024-08-25 VITALS — BP 110/70 | HR 77 | Temp 98.1°F | Resp 18 | Ht 71.0 in | Wt 289.8 lb

## 2024-08-25 DIAGNOSIS — Z0000 Encounter for general adult medical examination without abnormal findings: Secondary | ICD-10-CM

## 2024-08-25 DIAGNOSIS — I10 Essential (primary) hypertension: Secondary | ICD-10-CM

## 2024-08-25 DIAGNOSIS — E785 Hyperlipidemia, unspecified: Secondary | ICD-10-CM

## 2024-08-25 LAB — CBC WITH DIFFERENTIAL/PLATELET
Basophils Absolute: 0.1 K/uL (ref 0.0–0.1)
Basophils Relative: 0.9 % (ref 0.0–3.0)
Eosinophils Absolute: 0.2 K/uL (ref 0.0–0.7)
Eosinophils Relative: 2.2 % (ref 0.0–5.0)
HCT: 42.4 % (ref 39.0–52.0)
Hemoglobin: 14.6 g/dL (ref 13.0–17.0)
Lymphocytes Relative: 30.4 % (ref 12.0–46.0)
Lymphs Abs: 2.1 K/uL (ref 0.7–4.0)
MCHC: 34.3 g/dL (ref 30.0–36.0)
MCV: 88.5 fl (ref 78.0–100.0)
Monocytes Absolute: 0.5 K/uL (ref 0.1–1.0)
Monocytes Relative: 7.2 % (ref 3.0–12.0)
Neutro Abs: 4.1 K/uL (ref 1.4–7.7)
Neutrophils Relative %: 59.3 % (ref 43.0–77.0)
Platelets: 240 K/uL (ref 150.0–400.0)
RBC: 4.8 Mil/uL (ref 4.22–5.81)
RDW: 12.9 % (ref 11.5–15.5)
WBC: 6.9 K/uL (ref 4.0–10.5)

## 2024-08-25 LAB — COMPREHENSIVE METABOLIC PANEL WITH GFR
ALT: 51 U/L (ref 0–53)
AST: 29 U/L (ref 0–37)
Albumin: 4.8 g/dL (ref 3.5–5.2)
Alkaline Phosphatase: 38 U/L — ABNORMAL LOW (ref 39–117)
BUN: 14 mg/dL (ref 6–23)
CO2: 29 meq/L (ref 19–32)
Calcium: 9.6 mg/dL (ref 8.4–10.5)
Chloride: 103 meq/L (ref 96–112)
Creatinine, Ser: 1.2 mg/dL (ref 0.40–1.50)
GFR: 75.93 mL/min (ref >60.00)
Glucose, Bld: 82 mg/dL (ref 70–99)
Potassium: 4.3 meq/L (ref 3.5–5.1)
Sodium: 141 meq/L (ref 135–145)
Total Bilirubin: 0.9 mg/dL (ref 0.2–1.2)
Total Protein: 7.2 g/dL (ref 6.0–8.3)

## 2024-08-25 LAB — PSA: PSA: 0.38 ng/mL (ref 0.10–4.00)

## 2024-08-25 LAB — LIPID PANEL
Cholesterol: 118 mg/dL (ref 0–200)
HDL: 40.8 mg/dL (ref >39.00)
LDL Cholesterol: 61 mg/dL (ref 0–99)
NonHDL: 77.21
Total CHOL/HDL Ratio: 3
Triglycerides: 82 mg/dL (ref 0.0–149.0)
VLDL: 16.4 mg/dL (ref 0.0–40.0)

## 2024-08-25 LAB — TSH: TSH: 1.15 u[IU]/mL (ref 0.35–5.50)

## 2024-08-25 NOTE — Progress Notes (Signed)
 Subjective:    Patient ID: Maurice Jimenez, male    DOB: [birth year], 38 y.o.   MRN: [id number]  Chief Complaint  Patient presents with   Annual Exam    Pt states fasting     HPI Patient is in today for cpe.  Discussed the use of AI scribe software for clinical note transcription with the patient, who gave verbal consent to proceed.  History of Present Illness Maurice Jimenez is a 38 year old male who presents for an annual physical exam.  He reports feeling tired. He has been busy with work and has recently done yard work, including managing leaves in his yard.  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 He engages in physical activity through yard work and exercises as much as possible.  He does not require any medication refills at this time, as he received them a couple of weeks ago. He is up to date with his vaccinations, having received a tetanus shot last October.  No changes in family history. No flu shot received.    Past Medical History:  Diagnosis Date   GERD (gastroesophageal reflux disease)    History of chicken pox    Ingrown toenail     Past Surgical History:  Procedure Laterality Date   IRRIGATION AND DEBRIDEMENT SEBACEOUS CYST     l upper arm   NO PAST SURGERIES      Family History  Problem Relation Age of Onset   Diabetes Other        Paternal side   Healthy Mother        Living   Healthy Father        Living   Diabetes Brother        x1   Healthy Sister        x2   Healthy Son        x1   Healthy Daughter        x1    Social History   Socioeconomic History   Marital status: Married    Spouse name: brooke    Number of children: Not on file   Years of education: Not on file   Highest education level: Some college, no degree  Occupational History   Occupation: help Producer, Television/film/video: [employer]  Tobacco Use   Smoking status: Never   Smokeless tobacco: Never  Vaping Use    Vaping status: Never Used  Substance and Sexual Activity   Alcohol use: Not Currently    Comment: rare   Drug use: No   Sexual activity: Yes    Partners: Female  Other Topics Concern   Not on file  Social History Narrative   Walking daily   Social Drivers of Health   Financial Resource Strain: Medium Risk (01/29/2023)   Overall Financial Resource Strain (CARDIA)    Difficulty of Paying Living Expenses: Somewhat hard  Food Insecurity: No Food Insecurity (01/29/2023)   Hunger Vital Sign    Worried About Running Out of Food in the Last Year: Never true    Ran Out of Food in the Last Year: Never true  Transportation Needs: No Transportation Needs (01/29/2023)   PRAPARE - Administrator, Civil Service (Medical): No    Lack of Transportation (Non-Medical): No  Physical Activity: Insufficiently Active (01/29/2023)   Exercise Vital Sign    Days of Exercise per Week: 2 days    Minutes of Exercise  per Session: 30 min  Stress: No Stress Concern Present (01/29/2023)   [employer] of Occupational Health - Occupational Stress Questionnaire    Feeling of Stress : Only a little  Social Connections: Moderately Isolated (01/29/2023)   Social Connection and Isolation Panel    Frequency of Communication with Friends and Family: Once a week    Frequency of Social Gatherings with Friends and Family: Once a week    Attends Religious Services: More than 4 times per year    Active Member of [employer] or Organizations: No    Attends Engineer, Structural: Not on file    Marital Status: Married  Catering Manager Violence: Not on file    Outpatient Medications Prior to Visit  Medication Sig Dispense Refill   cyclobenzaprine  (FLEXERIL ) 10 MG tablet Take 1 tablet (10 mg total) by mouth 3 (three) times daily as needed for muscle spasms. 30 tablet 0   fenofibrate  160 MG tablet TAKE 1 TABLET BY MOUTH EVERY DAY 90 tablet 1   losartan  (COZAAR ) 100 MG tablet Take 1 tablet (100 mg total) by  mouth daily. 90 tablet 1   rosuvastatin  (CRESTOR ) 10 MG tablet TAKE 1 TABLET BY MOUTH EVERYDAY AT BEDTIME 90 tablet 1   benzonatate  (TESSALON ) 100 MG capsule Take 1-2 capsules (100-200 mg total) by mouth 3 (three) times daily as needed. 30 capsule 0   tirzepatide  (ZEPBOUND ) 5 MG/0.5ML Pen Inject 5 mg into the skin once a week. (Patient not taking: Reported on 08/25/2024) 2 mL 0   No facility-administered medications prior to visit.    No Known Allergies  Review of Systems  Constitutional:  Negative for chills, fever and malaise/fatigue.  HENT:  Negative for congestion and hearing loss.   Eyes:  Negative for discharge.  Respiratory:  Negative for cough, sputum production and shortness of breath.   Cardiovascular:  Negative for chest pain, palpitations and leg swelling.  Gastrointestinal:  Negative for abdominal pain, blood in stool, constipation, diarrhea, heartburn, nausea and vomiting.  Genitourinary:  Negative for dysuria, frequency, hematuria and urgency.  Musculoskeletal:  Negative for back pain, falls and myalgias.  Skin:  Negative for rash.  Neurological:  Negative for dizziness, sensory change, loss of consciousness, weakness and headaches.  Endo/Heme/Allergies:  Negative for environmental allergies. Does not bruise/bleed easily.  Psychiatric/Behavioral:  Negative for depression and suicidal ideas. The patient is not nervous/anxious and does not have insomnia.        Objective:    Physical Exam Vitals and nursing note reviewed.  Constitutional:      General: He is not in acute distress.    Appearance: Normal appearance. He is well-developed.  HENT:     Head: Normocephalic and atraumatic.     Right Ear: Tympanic membrane, ear canal and external ear normal. There is no impacted cerumen.     Left Ear: Tympanic membrane, ear canal and external ear normal. There is no impacted cerumen.     Nose: Nose normal.     Mouth/Throat:     Mouth: Mucous membranes are moist.      Pharynx: Oropharynx is clear. No oropharyngeal exudate or posterior oropharyngeal erythema.  Eyes:     General: No scleral icterus.       Right eye: No discharge.        Left eye: No discharge.     Conjunctiva/sclera: Conjunctivae normal.     Pupils: Pupils are equal, round, and reactive to light.  Neck:     Thyroid : No thyromegaly.  Vascular: No JVD.  Cardiovascular:     Rate and Rhythm: Normal rate and regular rhythm.     Heart sounds: Normal heart sounds. No murmur heard. Pulmonary:     Effort: Pulmonary effort is normal. No respiratory distress.     Breath sounds: Normal breath sounds.  Abdominal:     General: Bowel sounds are normal. There is no distension.     Palpations: Abdomen is soft. There is no mass.     Tenderness: There is no abdominal tenderness. There is no guarding or rebound.  Musculoskeletal:        General: Normal range of motion.     Cervical back: Normal range of motion and neck supple.     Right lower leg: No edema.     Left lower leg: No edema.  Lymphadenopathy:     Cervical: No cervical adenopathy.  Skin:    General: Skin is warm and dry.     Findings: No erythema or rash.  Neurological:     Mental Status: He is alert and oriented to person, place, and time.     Cranial Nerves: No cranial nerve deficit.     Motor: No abnormal muscle tone.     Deep Tendon Reflexes: Reflexes are normal and symmetric. Reflexes normal.  Psychiatric:        Mood and Affect: Mood normal.        Behavior: Behavior normal.        Thought Content: Thought content normal.        Judgment: Judgment normal.     BP 110/70 (BP Location: Left Arm, Patient Position: Sitting, Cuff Size: Large)   Pulse 77   Temp 98.1 F (36.7 C) (Oral)   Resp 18   Ht 5' 11 (1.803 m)   Wt 289 lb 12.8 oz (131.5 kg)   SpO2 97%   BMI 40.42 kg/m  Wt Readings from Last 3 Encounters:  08/25/24 289 lb 12.8 oz (131.5 kg)  02/04/24 287 lb 12.8 oz (130.5 kg)  08/06/23 266 lb (120.7 kg)     Diabetic Foot Exam - Simple   No data filed    Lab Results  Component Value Date   WBC 8.5 08/06/2023   HGB 14.9 08/06/2023   HCT 45.3 08/06/2023   PLT 282.0 08/06/2023   GLUCOSE 97 02/04/2024   CHOL 120 02/04/2024   TRIG 76.0 02/04/2024   HDL 42.00 02/04/2024   LDLDIRECT 123.0 04/24/2020   LDLCALC 63 02/04/2024   ALT 33 02/04/2024   AST 21 02/04/2024   NA 140 02/04/2024   K 4.0 02/04/2024   CL 102 02/04/2024   CREATININE 1.14 02/04/2024   BUN 17 02/04/2024   CO2 29 02/04/2024   TSH 1.65 08/06/2023   HGBA1C 5.7 07/29/2022    Lab Results  Component Value Date   TSH 1.65 08/06/2023   Lab Results  Component Value Date   WBC 8.5 08/06/2023   HGB 14.9 08/06/2023   HCT 45.3 08/06/2023   MCV 91.1 08/06/2023   PLT 282.0 08/06/2023   Lab Results  Component Value Date   NA 140 02/04/2024   K 4.0 02/04/2024   CO2 29 02/04/2024   GLUCOSE 97 02/04/2024   BUN 17 02/04/2024   CREATININE 1.14 02/04/2024   BILITOT 0.9 02/04/2024   ALKPHOS 37 (L) 02/04/2024   AST 21 02/04/2024   ALT 33 02/04/2024   PROT 7.5 02/04/2024   ALBUMIN 4.8 02/04/2024   CALCIUM  9.9 02/04/2024   ANIONGAP 12  11/22/2014   GFR 81.07 02/04/2024   Lab Results  Component Value Date   CHOL 120 02/04/2024   Lab Results  Component Value Date   HDL 42.00 02/04/2024   Lab Results  Component Value Date   LDLCALC 63 02/04/2024   Lab Results  Component Value Date   TRIG 76.0 02/04/2024   Lab Results  Component Value Date   CHOLHDL 3 02/04/2024   Lab Results  Component Value Date   HGBA1C 5.7 07/29/2022       Assessment & Plan:  Preventative health care Assessment & Plan: Ghm utd Check labs See AVS  Health Maintenance  Topic Date Due   HIV Screening  Never done   Hepatitis B Vaccines 19-59 Average Risk (1 of 3 - 19+ 3-dose series) Never done   HPV VACCINES (1 - 3-dose SCDM series) Never done   COVID-19 Vaccine (1 - 2025-26 season) Never done   Influenza Vaccine  01/10/2025  (Originally 05/13/2024)   DTaP/Tdap/Td (2 - Td or Tdap) 08/05/2033   Hepatitis C Screening  Completed   Pneumococcal Vaccine  Aged Out   Meningococcal B Vaccine  Aged Out     Orders: -     Lipid panel -     PSA -     TSH -     Comprehensive metabolic panel with GFR -     CBC with Differential/Platelet  Primary hypertension Assessment & Plan: Well controlled, no changes to meds. Encouraged heart healthy diet such as the DASH diet and exercise as tolerated.    Orders: -     Lipid panel -     TSH -     Comprehensive metabolic panel with GFR -     CBC with Differential/Platelet  Hyperlipidemia, unspecified hyperlipidemia type Assessment & Plan: Encourage heart healthy diet such as MIND or DASH diet, increase exercise, avoid trans fats, simple carbohydrates and processed foods, consider a krill or fish or flaxseed oil cap daily.    Orders: -     Lipid panel -     Comprehensive metabolic panel with GFR  Morbid obesity (HCC)   Assessment and Plan Assessment & Plan   Adult Wellness Visit   Routine adult wellness visit conducted with no changes in family history. He engages in physical activity through yard work. No flu shot was administered, but tetanus vaccination is current.  Morbid obesity due to excess calories   There are insurance issues with Zepbound , with potential changes expected in the next several months to a year.   Dmya Long R Lowne Chase, DO

## 2024-08-25 NOTE — Assessment & Plan Note (Signed)
 Ins will not pay for zepbound   Con't diet and exercise

## 2024-08-25 NOTE — Assessment & Plan Note (Signed)
 Encourage heart healthy diet such as MIND or DASH diet, increase exercise, avoid trans fats, simple carbohydrates and processed foods, consider a krill or fish or flaxseed oil cap daily.

## 2024-08-25 NOTE — Assessment & Plan Note (Signed)
 Ghm utd Check labs See AVS  Health Maintenance  Topic Date Due   HIV Screening  Never done   Hepatitis B Vaccines 19-59 Average Risk (1 of 3 - 19+ 3-dose series) Never done   HPV VACCINES (1 - 3-dose SCDM series) Never done   COVID-19 Vaccine (1 - 2025-26 season) Never done   Influenza Vaccine  01/10/2025 (Originally 05/13/2024)   DTaP/Tdap/Td (2 - Td or Tdap) 08/05/2033   Hepatitis C Screening  Completed   Pneumococcal Vaccine  Aged Out   Meningococcal B Vaccine  Aged Out

## 2024-08-25 NOTE — Assessment & Plan Note (Signed)
 Well controlled, no changes to meds. Encouraged heart healthy diet such as the DASH diet and exercise as tolerated.

## 2024-09-01 ENCOUNTER — Ambulatory Visit: Payer: Self-pay | Admitting: Family Medicine

## 2024-10-04 ENCOUNTER — Encounter: Payer: Self-pay | Admitting: Family Medicine

## 2024-10-26 ENCOUNTER — Other Ambulatory Visit: Payer: Self-pay | Admitting: Family Medicine

## 2024-10-26 MED ORDER — WEGOVY 1.5 MG PO TABS: 1.5000 mg | ORAL_TABLET | Freq: Every day | ORAL | 0 refills | Status: AC

## 2024-10-26 MED ORDER — NONFORMULARY OR COMPOUNDED ITEM: 0 refills | Status: AC

## 2024-10-27 ENCOUNTER — Telehealth: Payer: Self-pay

## 2024-10-27 NOTE — Telephone Encounter (Signed)
 PA approved. Effective 10/27/2024 to 04/25/2025

## 2024-10-27 NOTE — Telephone Encounter (Signed)
 PA initiated via Covermymeds; KEY: [id number]. Awaiting determination.

## 2024-10-30 ENCOUNTER — Telehealth: Admitting: Family

## 2024-10-30 DIAGNOSIS — J208 Acute bronchitis due to other specified organisms: Secondary | ICD-10-CM

## 2024-10-30 DIAGNOSIS — B9689 Other specified bacterial agents as the cause of diseases classified elsewhere: Secondary | ICD-10-CM

## 2024-10-30 MED ORDER — BENZONATATE 100 MG PO CAPS: 100.0000 mg | ORAL_CAPSULE | Freq: Two times a day (BID) | ORAL | 0 refills | Status: AC | PRN

## 2024-10-30 MED ORDER — AZITHROMYCIN 250 MG PO TABS: ORAL_TABLET | ORAL | 0 refills | Status: AC

## 2024-10-30 MED ORDER — PREDNISONE 10 MG (21) PO TBPK: ORAL_TABLET | ORAL | 0 refills | Status: AC

## 2024-10-30 NOTE — Progress Notes (Signed)
 We are sorry that you are not feeling well.  Here is how we plan to help!  Based on your presentation I believe you most likely have A cough due to bacteria.  When patients have a fever and a productive cough with a change in color or increased sputum production, we are concerned about bacterial bronchitis.  If left untreated it can progress to pneumonia.  If your symptoms do not improve with your treatment plan it is important that you contact your provider.   I have prescribed Azithromyin 250 mg: two tablets now and then one tablet daily for 4 additonal days    In addition you may use A non-prescription cough medication called Robitussin DAC. Take 2 teaspoons every 8 hours or Delsym: take 2 teaspoons every 12 hours., A non-prescription cough medication called Mucinex DM: take 2 tablets every 12 hours., and A prescription cough medication called Tessalon  Perles 100mg . You may take 1-2 capsules every 8 hours as needed for your cough.  Prednisone  10 mg daily for 6 days (see taper instructions below)  Directions for 6 day taper: Day 1: 2 tablets before breakfast, 1 after both lunch & dinner and 2 at bedtime Day 2: 1 tab before breakfast, 1 after both lunch & dinner and 2 at bedtime Day 3: 1 tab at each meal & 1 at bedtime Day 4: 1 tab at breakfast, 1 at lunch, 1 at bedtime Day 5: 1 tab at breakfast & 1 tab at bedtime Day 6: 1 tab at breakfast  From your responses in the eVisit questionnaire you describe inflammation in the upper respiratory tract which is causing a significant cough.  This is commonly called Bronchitis and has four common causes:   Allergies Viral Infections Acid Reflux Bacterial Infection Allergies, viruses and acid reflux are treated by controlling symptoms or eliminating the cause. An example might be a cough caused by taking certain blood pressure medications. You stop the cough by changing the medication. Another example might be a cough caused by acid reflux. Controlling the  reflux helps control the cough.  USE OF BRONCHODILATOR (RESCUE) INHALERS: There is a risk from using your bronchodilator too frequently.  The risk is that over-reliance on a medication which only relaxes the muscles surrounding the breathing tubes can reduce the effectiveness of medications prescribed to reduce swelling and congestion of the tubes themselves.  Although you feel brief relief from the bronchodilator inhaler, your asthma may actually be worsening with the tubes becoming more swollen and filled with mucus.  This can delay other crucial treatments, such as oral steroid medications. If you need to use a bronchodilator inhaler daily, several times per day, you should discuss this with your provider.  There are probably better treatments that could be used to keep your asthma under control.     HOME CARE Only take medications as instructed by your medical team. Complete the entire course of an antibiotic. Drink plenty of fluids and get plenty of rest. Avoid close contacts especially the very young and the elderly Cover your mouth if you cough or cough into your sleeve. Always remember to wash your hands A steam or ultrasonic humidifier can help congestion.   GET HELP RIGHT AWAY IF: You develop worsening fever. You become short of breath You cough up blood. Your symptoms persist after you have completed your treatment plan MAKE SURE YOU  Understand these instructions. Will watch your condition. Will get help right away if you are not doing well or get worse.  Your e-visit answers were reviewed by a board certified advanced clinical practitioner to complete your personal care plan.  Depending on the condition, your plan could have included both over the counter or prescription medications. If there is a problem please reply  once you have received a response from your provider. Your safety is important to us .  If you have drug allergies check your prescription carefully.    You  can use MyChart to ask questions about todays visit, request a non-urgent call back, or ask for a work or school excuse for 24 hours related to this e-Visit. If it has been greater than 24 hours you will need to follow up with your provider, or enter a new e-Visit to address those concerns. You will get an e-mail in the next two days asking about your experience.  I hope that your e-visit has been valuable and will speed your recovery. Thank you for using e-visits.   I have spent 5 minutes in review of e-visit questionnaire, review and updating patient chart, medical decision making and response to patient.   Bari Learn, FNP

## 2024-11-10 ENCOUNTER — Other Ambulatory Visit: Payer: Self-pay | Admitting: Family Medicine

## 2025-08-29 ENCOUNTER — Encounter: Admitting: Family Medicine
